# Patient Record
Sex: Male | Born: 1935 | Race: White | Hispanic: No | Marital: Married | Smoking: Former smoker
Health system: Southern US, Community
[De-identification: ages and names within clinical notes are randomized; demographics above are authoritative.]

## PROBLEM LIST (undated history)

## (undated) DIAGNOSIS — I10 Essential (primary) hypertension: Secondary | ICD-10-CM

## (undated) DIAGNOSIS — K635 Polyp of colon: Secondary | ICD-10-CM

## (undated) DIAGNOSIS — E7800 Pure hypercholesterolemia, unspecified: Secondary | ICD-10-CM

## (undated) DIAGNOSIS — K219 Gastro-esophageal reflux disease without esophagitis: Secondary | ICD-10-CM

## (undated) DIAGNOSIS — C61 Malignant neoplasm of prostate: Secondary | ICD-10-CM

## (undated) DIAGNOSIS — I739 Peripheral vascular disease, unspecified: Secondary | ICD-10-CM

## (undated) HISTORY — PX: CHOLECYSTECTOMY: SHX55

## (undated) HISTORY — DX: Peripheral vascular disease, unspecified: I73.9

---

## 1998-05-23 HISTORY — PX: COLON SURGERY: SHX602

## 2002-02-15 ENCOUNTER — Encounter: Payer: Self-pay | Admitting: *Deleted

## 2002-02-15 ENCOUNTER — Encounter: Admission: RE | Admit: 2002-02-15 | Discharge: 2002-02-15 | Payer: Self-pay | Admitting: *Deleted

## 2007-07-17 ENCOUNTER — Ambulatory Visit: Admission: RE | Admit: 2007-07-17 | Discharge: 2007-10-15 | Payer: Self-pay | Admitting: Radiation Oncology

## 2007-07-24 ENCOUNTER — Emergency Department (HOSPITAL_COMMUNITY): Admission: EM | Admit: 2007-07-24 | Discharge: 2007-07-25 | Payer: Self-pay | Admitting: Family Medicine

## 2007-08-29 ENCOUNTER — Encounter: Admission: RE | Admit: 2007-08-29 | Discharge: 2007-08-29 | Payer: Self-pay | Admitting: Internal Medicine

## 2007-10-01 ENCOUNTER — Encounter: Admission: RE | Admit: 2007-10-01 | Discharge: 2007-10-01 | Payer: Self-pay | Admitting: Urology

## 2007-10-03 ENCOUNTER — Encounter: Admission: RE | Admit: 2007-10-03 | Discharge: 2007-12-03 | Payer: Self-pay | Admitting: Neurology

## 2007-10-22 ENCOUNTER — Ambulatory Visit (HOSPITAL_BASED_OUTPATIENT_CLINIC_OR_DEPARTMENT_OTHER): Admission: RE | Admit: 2007-10-22 | Discharge: 2007-10-22 | Payer: Self-pay | Admitting: Urology

## 2007-11-06 ENCOUNTER — Ambulatory Visit (HOSPITAL_BASED_OUTPATIENT_CLINIC_OR_DEPARTMENT_OTHER): Admission: RE | Admit: 2007-11-06 | Discharge: 2007-11-06 | Payer: Self-pay | Admitting: Orthopedic Surgery

## 2007-11-07 ENCOUNTER — Ambulatory Visit: Admission: RE | Admit: 2007-11-07 | Discharge: 2007-11-29 | Payer: Self-pay | Admitting: Radiation Oncology

## 2007-12-31 ENCOUNTER — Encounter: Admission: RE | Admit: 2007-12-31 | Discharge: 2007-12-31 | Payer: Self-pay | Admitting: Gastroenterology

## 2008-01-18 ENCOUNTER — Inpatient Hospital Stay (HOSPITAL_COMMUNITY): Admission: RE | Admit: 2008-01-18 | Discharge: 2008-01-22 | Payer: Self-pay | Admitting: General Surgery

## 2008-01-18 ENCOUNTER — Encounter (INDEPENDENT_AMBULATORY_CARE_PROVIDER_SITE_OTHER): Payer: Self-pay | Admitting: General Surgery

## 2009-03-10 ENCOUNTER — Encounter (INDEPENDENT_AMBULATORY_CARE_PROVIDER_SITE_OTHER): Payer: Self-pay | Admitting: Urology

## 2009-03-10 ENCOUNTER — Ambulatory Visit (HOSPITAL_BASED_OUTPATIENT_CLINIC_OR_DEPARTMENT_OTHER): Admission: RE | Admit: 2009-03-10 | Discharge: 2009-03-10 | Payer: Self-pay | Admitting: Urology

## 2009-06-16 IMAGING — CT CT PELVIS W/ CM
2 of 5 series · 16 of 46 positions shown, 18 images · IV contrast (READICAT/WATER & [ID] OMNI 300)
Comparison: No prior studies.

CT ABDOMEN

CLINICAL DATA: Cecal mass found on endoscopy.  Please evaluate.

CT ABDOMEN AND PELVIS WITH CONTRAST
TECHNIQUE: Multidetector CT imaging of the abdomen and pelvis was
performed using the standard protocol following bolus
administration of intravenous contrast.
Contrast: 125 ml Rmnipaque-KZZ.

[Series 3: routine abdomen · axial · 0.81mm/px · z∈[-468,-68]mm · 13 of 90 slices shown, 15 images]
[im 5/90  soft-tissue]
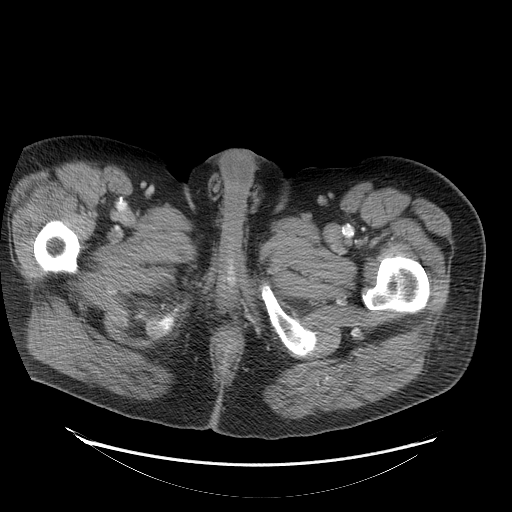
[im 5/90  bone]
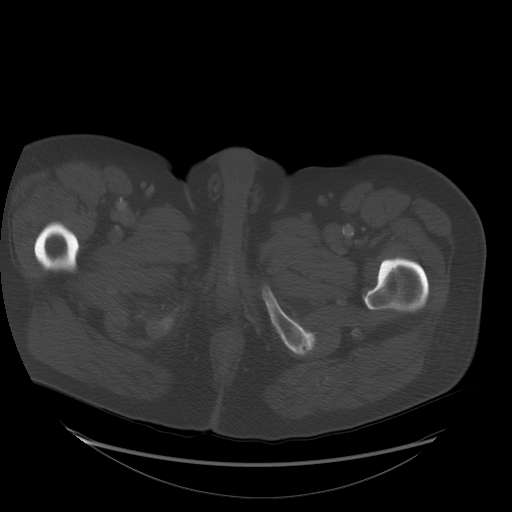
[im 10/90  soft-tissue]
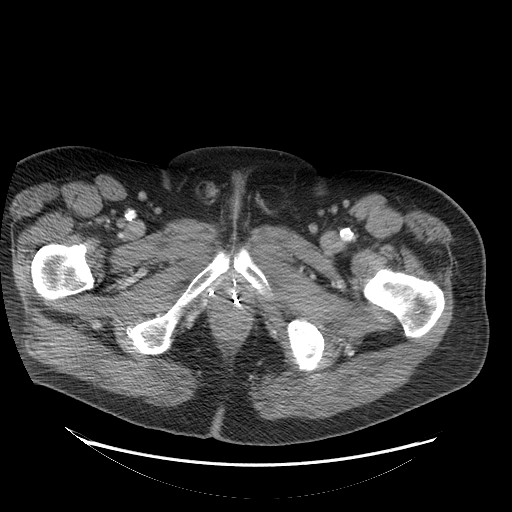
[im 20/90  soft-tissue]
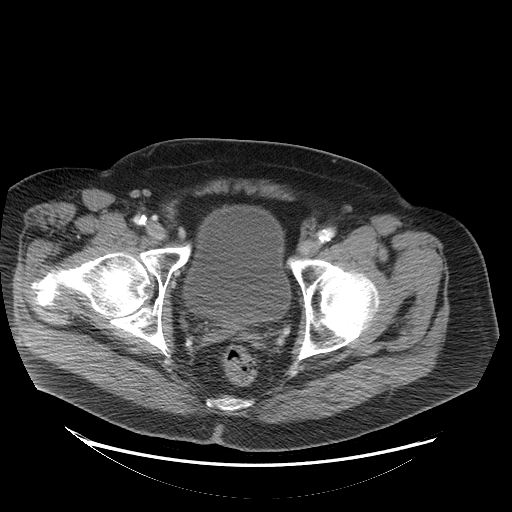
[im 25/90  soft-tissue]
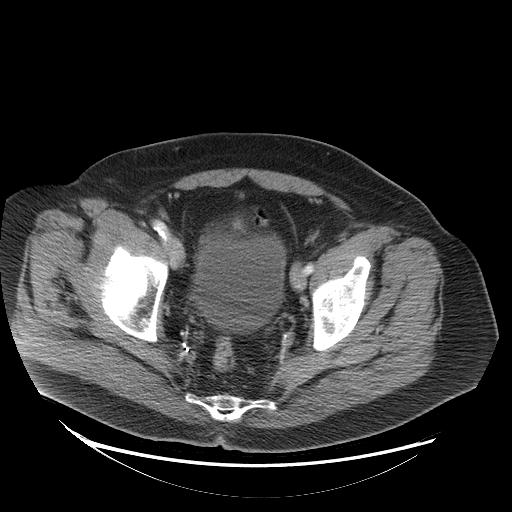
[im 30/90  soft-tissue]
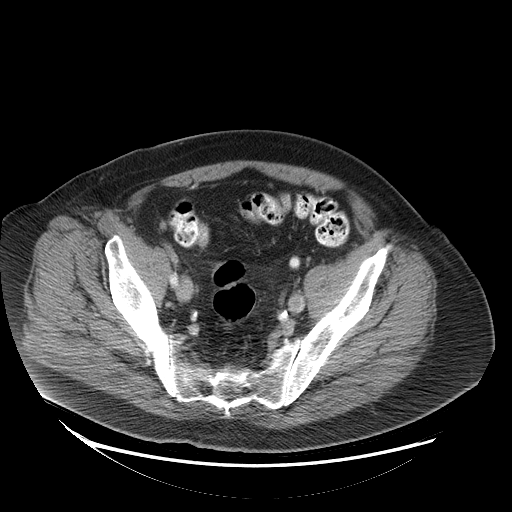
[im 40/90  soft-tissue]
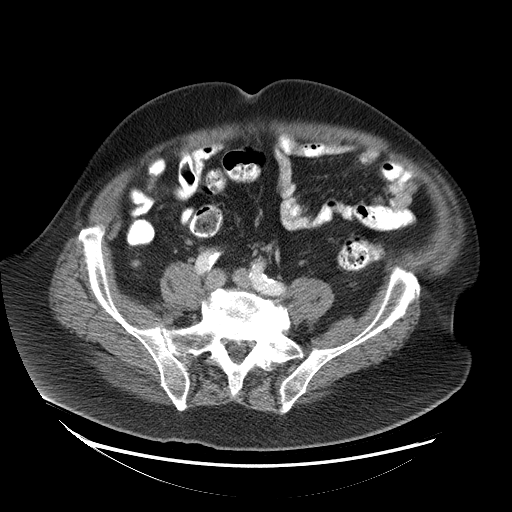
[im 45/90  soft-tissue]
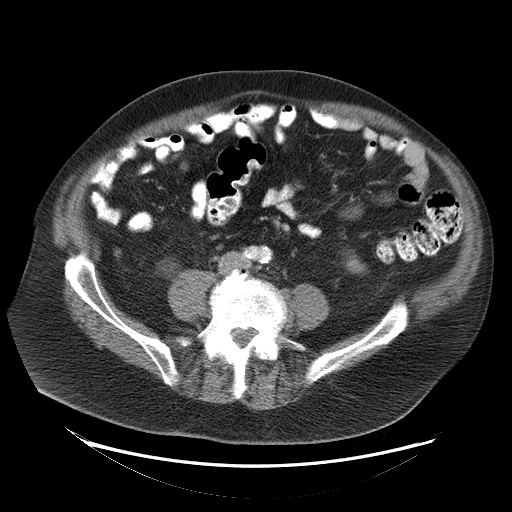
[im 50/90  soft-tissue]
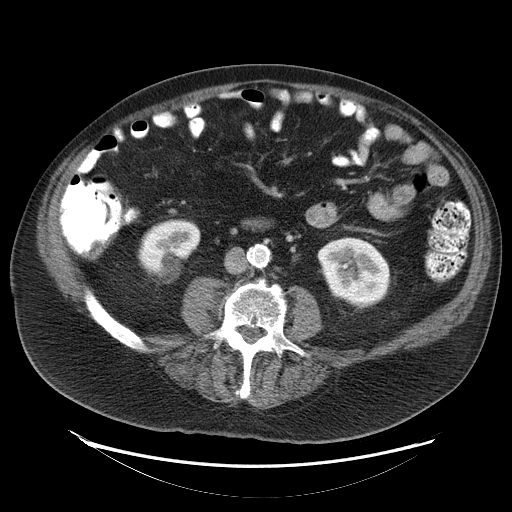
[im 60/90  soft-tissue]
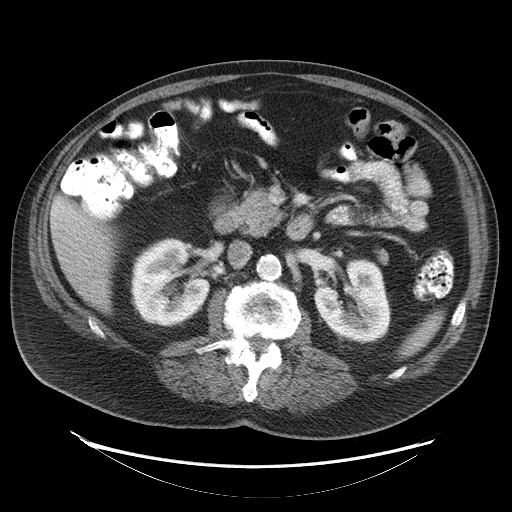
[im 60/90  bone]
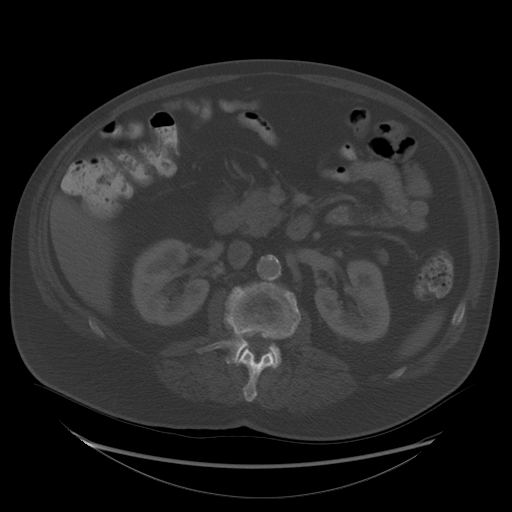
[im 65/90  soft-tissue]
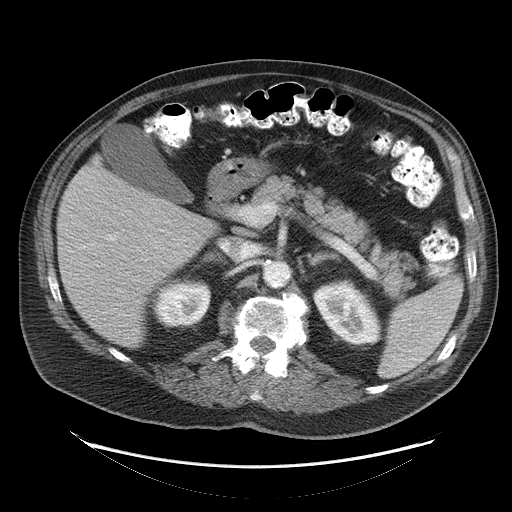
[im 70/90  soft-tissue]
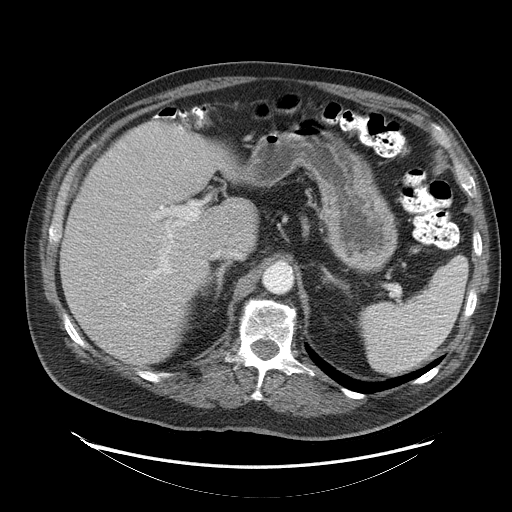
[im 80/90  soft-tissue]
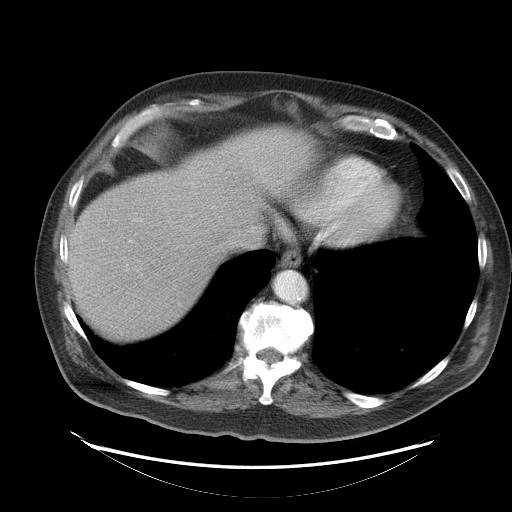
[im 85/90  soft-tissue]
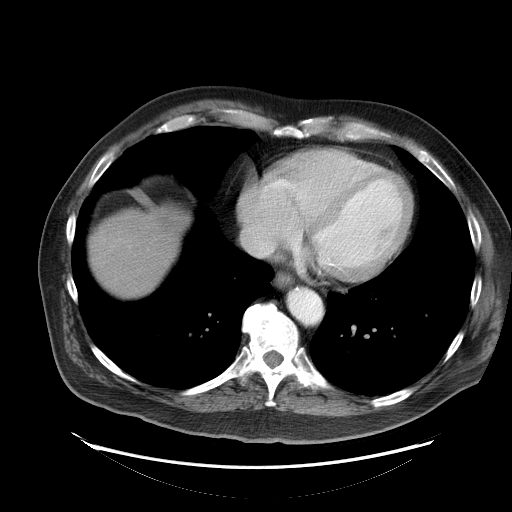

[Series 602: sagittal body · sagittal · 0.89mm/px · 3 of 168 slices shown]
[im 56/168  soft-tissue]
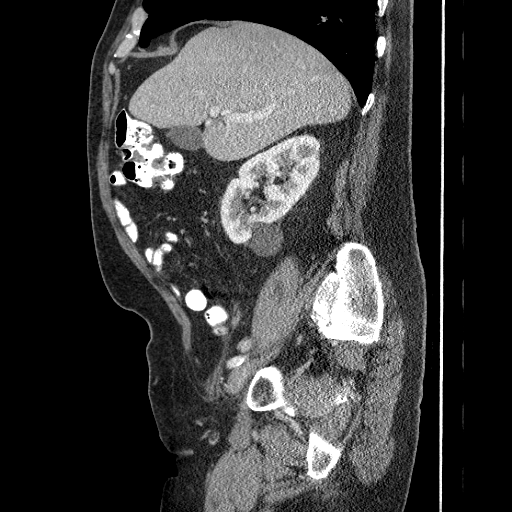
[im 75/168  soft-tissue]
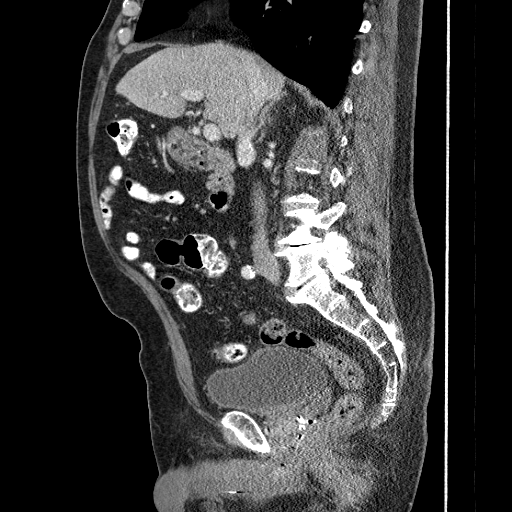
[im 93/168  soft-tissue]
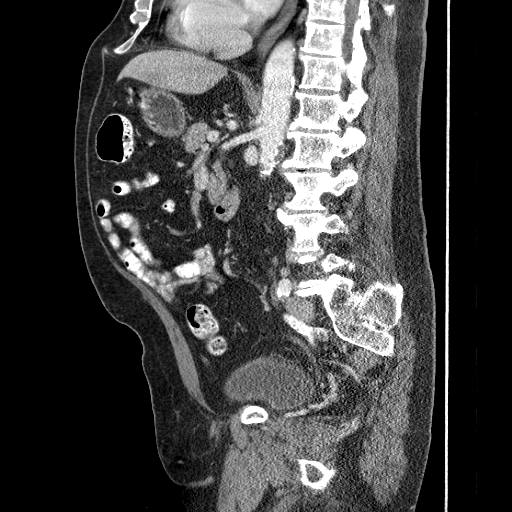

[16 of 46 positions shown; findings below may reference images not displayed]

FINDINGS: Scans of the lung bases are negative.  The liver,
spleen, pancreas, and adrenal glands have a normal appearance.
There is a 2.9 cm in size simple appearing lower pole right renal
cyst present and  there is a small (1.7 cm) simple appearing  left
renal cyst present.  There is thickening of the posterior inferior
wall of the cecum near the base of the appendix.  Likely, this
represents the cecal mass noted on endoscopy.  In addition,  there
are several mildly prominent mesenteric lymph nodes within the
right lower quadrant located anterior to the right kidney.  A lymph
node measuring 8 x 11 mm in size is seen on image number 37 series
3.  A lymph node measuring 6 x 9 mm in size is noted on image 41
series 3.  Also there is a portacaval lymph node present measuring
8 x 24 mm in size on image 20 six series 3.  There is no
retroperitoneal adenopathy.
IMPRESSION: 1.  Thickening of the wall of the base of the cecum near the
insertion site of the appendix.  Likely, this represents the cecal
mass noted on endoscopy.  This measures approximately 1.4 x 2.8 cm
in size on image 42 series [DATE].  Mildly prominent mesenteric lymph nodes noted within the right
lower quadrant anterior to the right kidney as discussed above.
These may represent a neoplastic mesenteric lymph nodes.  However,
in this size range these may also represent reactive lymph nodes.
There is also a mild to moderately enlarged portacaval lymph node
noted as well.  No evidence for hepatic metastases.

CT PELVIS
FINDINGS: Again noted is the thickening of the wall of the base of
the cecum most likely accounting for the cecal mass noted at
endoscopy.  There is no pelvic adenopathy or free pelvic fluid.
The urinary bladder has a normal appearance.  Multiple radio active
seeds are seen associated with the prostate gland.
IMPRESSION: Negative CT the pelvis aside for the cecal mass noted and described
on the CT of the abdomen report.

## 2009-09-29 ENCOUNTER — Ambulatory Visit (HOSPITAL_BASED_OUTPATIENT_CLINIC_OR_DEPARTMENT_OTHER): Admission: RE | Admit: 2009-09-29 | Discharge: 2009-09-29 | Payer: Self-pay | Admitting: Urology

## 2010-01-14 ENCOUNTER — Encounter: Admission: RE | Admit: 2010-01-14 | Discharge: 2010-01-14 | Payer: Self-pay | Admitting: Family Medicine

## 2010-01-26 ENCOUNTER — Encounter: Admission: RE | Admit: 2010-01-26 | Discharge: 2010-01-26 | Payer: Self-pay | Admitting: Family Medicine

## 2010-06-13 ENCOUNTER — Encounter: Payer: Self-pay | Admitting: Internal Medicine

## 2010-08-10 LAB — POCT I-STAT 4, (NA,K, GLUC, HGB,HCT)
Glucose, Bld: 126 mg/dL — ABNORMAL HIGH (ref 70–99)
HCT: 40 % (ref 39.0–52.0)
Sodium: 140 mEq/L (ref 135–145)

## 2010-08-26 LAB — CBC
MCHC: 34.8 g/dL (ref 30.0–36.0)
RDW: 13.4 % (ref 11.5–15.5)

## 2010-08-26 LAB — BASIC METABOLIC PANEL
CO2: 27 mEq/L (ref 19–32)
Calcium: 9.9 mg/dL (ref 8.4–10.5)
Creatinine, Ser: 0.82 mg/dL (ref 0.4–1.5)
Glucose, Bld: 120 mg/dL — ABNORMAL HIGH (ref 70–99)

## 2010-10-05 NOTE — Op Note (Signed)
NAMEMEET, WEATHINGTON                ACCOUNT NO.:  [id number]   MEDICAL RECORD NO.:  [id number]          PATIENT TYPE:  AMB   LOCATION:  [facility]SC                          FACILITY:  [facility]   PHYSICIAN:  Katy Fitch. Sypher, M.D. DATE OF BIRTH:  07-Mar-1934   DATE OF PROCEDURE:  11/06/2007  DATE OF DISCHARGE:                               OPERATIVE REPORT   PREOPERATIVE DIAGNOSES:  Entrapped neuropathy, median nerve, right  carpal tunnel.   POSTOPERATIVE DIAGNOSES:  Entrapped neuropathy, median nerve, right  carpal tunnel.   OPERATION:  Release of right transverse carpal ligament.   OPERATING SURGEON:  Katy Fitch. Sypher, M.D.   ASSISTANT:  None.   ANESTHESIA:  General by LMA.   SUPERVISING ANESTHESIOLOGIST:  Kaylyn Layer. Michelle Piper, M.D.   INDICATIONS:  Jevan Gaunt is a 77 year old man referred to the courtesy  of Dr. Melbourne Abts for evaluation and management of his stiff and numb  right hand.  Clinical examination revealed signs of severe carpal tunnel  syndrome.  Prior electrodiagnostic studies confirmed significant median  neuropathy.   Due to failure to respond to the nonoperative measures, Mr. Speigner was  brought to the operating room at this time for release of his right  transverse carpal ligament.   PROCEDURE:  Kenny Rea was brought to the operating room and placed in  supine position on the operating table.  More than 30 years ago, Mr.  Bynum sustained a complex fracture of his right elbow.  He had  inability to fully extend the elbow and inability to supinate beyond  approximately 30 degrees.  We accommodated this during his surgery.   After informed consent, general anesthesia by LMA technique was induced  followed by routine Betadine scrub and paint of the right upper  extremity.  A pneumatic tourniquet was applied to the proximal right  brachium.   Following exsanguination of the right arm with an Esmarch bandage, the  tourniquet was insufflated to 220 mmHg.  Procedure  commenced with a  short incision in the line of the ring finger of the palm.  Subcutaneous  tissues were carefully divided via palmar fascia.  This split  longitudinally to reveal common sensory branch of the median nerve.  These were followed back to transcarpal ligament, which was gently  isolated from median nerve.  The ligament was then released along its  ulnar border extending into the distal forearm.   This widely opened carpal canal.  No mass or particular instrument  noted.   The wounds then repaired with intradermal 2-0 Prolene suture.   A compressive dressing was applied with volar plaster splint maintaining  the wrist in 5 degrees of dorsiflexion.      Katy Fitch Sypher, M.D.  Electronically Signed     RVS/MEDQ  D:  11/06/2007  T:  11/07/2007  Job:  [id number]

## 2010-10-05 NOTE — Discharge Summary (Signed)
NAMEMARCELLES, CLINARD                ACCOUNT NO.:  [id number]   MEDICAL RECORD NO.:  [id number]          PATIENT TYPE:  INP   LOCATION:  5150                         FACILITY:  [facility]   PHYSICIAN:  Cherylynn Ridges, M.D.    DATE OF BIRTH:  1934-05-02   DATE OF ADMISSION:  01/18/2008  DATE OF DISCHARGE:  01/22/2008                               DISCHARGE SUMMARY   DISCHARGE DIAGNOSIS:  Dysplastic pedunculated cecal polyp.   PRINCIPAL PROCEDURE:  Right colectomy.   Additional diagnoses include hypertension and gastroesophageal reflux  disease.   DISCHARGE MEDICATIONS:  In addition to his preoperative medication, he  will be getting Percocet 5/325 tablets one to two every 4 hours as  needed for pain.  He also has a history of prostate cancer.  He will be  discharged home in the care of his family.  He will return to see me,  Dr. Lindie Spruce, in 2 weeks.   BRIEF SUMMARY OF HOSPITAL COURSE:  The patient was admitted on the day  of his procedure at which time he underwent a right colectomy for a  pedunculated polyp.  Pathology subsequently revealed this to be a  dysplastic pedunculated polyp with no evidence of malignancy or  lymphatic involvement.  Postoperatively, he did well.  He had low-grade  fevers, but by the time of discharge, he had been afebrile for more than  48 hours.  He had had three bowel movements in the last 48 hours, last 2  coming the day of discharge with minimal bleeding.  His wound was  slightly erythematous posterolaterally, but this is likely secondary to  the dependent blood loss in the subcutaneous tissue.  The patient had a  subcuticular stitch of the transverse incision in the right perirectus  area, which is healing well.  No evidence of infection or drainage.  He  is eating well, having good bowel movements, and he will be discharged  home in the care of his family.  He will come back to see me in about 2  weeks.      Cherylynn Ridges, M.D.  Electronically  Signed     JOW/MEDQ  D:  01/22/2008  T:  01/23/2008  Job:  [id number]

## 2010-10-05 NOTE — Op Note (Signed)
NAMEDAEGON, DEISS                ACCOUNT NO.:  [id number]   MEDICAL RECORD NO.:  [id number]         PATIENT TYPE:  HAMB   LOCATION:                               FACILITY:  [facility]   PHYSICIAN:  Houston M. Kimbrough, M.D.DATE OF BIRTH:  [birth year]   DATE OF PROCEDURE:  10/22/2007  DATE OF DISCHARGE:                               OPERATIVE REPORT   PREOPERATIVE DIAGNOSIS:  Carcinoma of prostate, clinical stage T2b,  Gleason 4+3 adenocarcinoma of the prostate.   POSTOPERATIVE DIAGNOSIS:  Carcinoma of prostate, clinical stage T2b,  Gleason 4+3 adenocarcinoma of the prostate.   OPERATIONS:  Cystoscopy, prostate brachytherapy.   SURGEON:  Courtney Paris, M.D.   ASSISTANT:  Maryln Gottron, M.D.   ANESTHESIA:  General.   BRIEF HISTORY:  This 77 year old patient is admitted with intermediate  prostate cancer T2b, Gleason 4+3 for seed implant.  He finished his  external beam radiotherapy on Sep 28, 2007, and enters now for a seed  implant.  He understands the risks as explained to him.   DESCRIPTION OF PROCEDURE:  The patient was placed on the operating table  in the dorsal lithotomy position.  After satisfactory induction of  general endotracheal anesthesia, he was prepped and draped with Betadine  in the usual sterile fashion.  A Foley catheter and rectal tube were  inserted, as well as the 7.5 MHz ultrasound probe.  Dr. Dayton Scrape then  planned the procedure and then I was called in.  He had a total of 24  needles with 71 I-125 seeds implanted with good placement of the seeds,  at least by ultrasound and by fluoroscopy.  We used the Nucletron to  deliver and build the seeds.  When this was done, the patient was then  reprepped and draped and a flexible cystoscope was used to inspect the  anterior urethra.  There was a small blood clot in the posterior  urethra, but no seeds seen and the bladder was carefully inspected.  There were no seeds either in the bladder or that I could  see coming  through the mucosa of the prostate on retrograde view.  The bladder was  drained.  A 16 Foley catheter inserted and the patient taken to the  recovery room in good condition where he will be later discharged as an  outpatient.  Will give him detailed written instructions, and he will  remove his catheter in 48 hours.      Courtney Paris, M.D.  Electronically Signed     HMK/MEDQ  D:  10/22/2007  T:  10/22/2007  Job:  [id number]

## 2010-10-05 NOTE — Op Note (Signed)
[name], CROSS NO.:  [id number]   MEDICAL RECORD NO.:  [id number]          PATIENT TYPE:  INP   LOCATION:  5118                         FACILITY:  [facility]   PHYSICIAN:  Cherylynn Ridges, M.D.    DATE OF BIRTH:  [birth year]   DATE OF PROCEDURE:  01/18/2008  DATE OF DISCHARGE:                               OPERATIVE REPORT   PREOPERATIVE DIAGNOSIS:  Cecal mass.   POSTOPERATIVE DIAGNOSIS:  Pedunculated cecal mass.   PROCEDURE:  Right colectomy.   SURGEON:  Marta Lamas. Lindie Spruce, MD   ASSISTANT:  Leonie Man, MD   ANESTHESIA:  General endotracheal.   ESTIMATED BLOOD LOSS:  100 mL.   COMPLICATIONS:  None.   CONDITION:  Stable.   SPECIMEN:  Terminal ileum and right colon.   INDICATIONS FOR OPERATION:  The patient is a 77 year old on previous  blood thinners who underwent a colonoscopy and who was found to have a  large right colon cecal mass.  Ethan Harrison now comes in for right colectomy.   FINDINGS AT SURGERY:  The patient was found to have a pedunculated cecal  mass upon opening the specimen after it removing from the field.  There  was no evidence of any intra-abdominal metastatic disease or other  pathology.   OPERATION:  The patient was taken to the operating room and placed on  table in supine position.  After an adequate general endotracheal  anesthetic was administered, he was prepped and draped in usual sterile  manner exposing the midline and the right abdomen.   We removed the specimen through a right transverse incision which went  through the anterior and posterior sheath and rectus muscle and  transecting the rectus sheath and also transecting the rectus muscle.  We took it down into the intra-abdominal cavity where we were able to  mobilize the right colon along the line of Toldt up to and extending  medial to the hepatic flexure.  We mobilized beyond the duodenum.  We  also mobilized what was an retrocecal appendix and I did resect the  appendix  along with the specimen in toto.  Once we had adequate  mobilization of the terminal ileum and the right transverse colon, we  came across the terminal ileum and right transverse colon using a GIA-75  stapler.  We then came across the intervening mesentery which was  thickened but did not appear to have significant adenopathy with Kelly  clamps and 2-0 silk ties.  The LigaSure was also used in some places.   There was adequate hemostasis, but after the specimen was removed we  packed the abdomen and then I opened the specimen on a separate table  exposing the mucosal surface.  A pedunculated mass in the cecum was  noted.   I changed out the outer layer of my gloves and then went back and  obtained hemostasis with electrocautery and irrigated.  We then did a  functional end-to-end and side-to-side anastomosis between the terminal  ileum and the right transverse colon using a GIA-75 stapler.  The  resulting enterotomy was closed using TX-60  stapler.  This was with a  3.5-mm closure staples.  The mesentery was closed using interrupted 2-0  silk sutures.  We attached the corner with 2-0 silk suture.  Once that  was done, we allowed the new anastomotic area to fall back into the  right lower quadrant and then covered it with omentum which had an  adequate length to go down to right lower quadrant.  We irrigated with  saline solution and then we closed in several layers.   The posterior rectus sheath was closed using a looped #1 PDS suture.  The anterior rectus sheath was closed using a running looped #1 PDS  suture and then we irrigated with saline solution in the subcu and  closed the skin using running subcuticular stitch of 4-0 Monocryl.  Marcaine 0.5% without epinephrine 20 mL were used to inject into the  wound.  We then covered with Dermabond, Steri-Strips, and Tegaderm.  All  needle counts, sponge counts, and instrument counts were correct.      Cherylynn Ridges, M.D.   Electronically Signed     JOW/MEDQ  D:  01/18/2008  T:  01/19/2008  Job:  [id number]   cc:   Marcene Duos, M.D.  Anselmo Rod, M.D.

## 2011-02-14 LAB — CBC
Hemoglobin: 14.1
RBC: 4.41
RDW: 12.9

## 2011-02-14 LAB — DIFFERENTIAL
Basophils Absolute: 0
Basophils Relative: 0
Eosinophils Absolute: 0
Eosinophils Relative: 0
Lymphocytes Relative: 14
Lymphs Abs: 1.6
Monocytes Absolute: 0.9
Monocytes Relative: 7
Neutro Abs: 9.4 — ABNORMAL HIGH
Neutrophils Relative %: 79 — ABNORMAL HIGH

## 2011-02-14 LAB — BASIC METABOLIC PANEL
Calcium: 9.3
GFR calc Af Amer: >60
GFR calc non Af Amer: >60
Sodium: 135

## 2011-02-14 LAB — PROTIME-INR: INR: 1

## 2011-02-14 LAB — APTT: aPTT: 34

## 2011-02-16 LAB — PROTIME-INR
INR: 1
Prothrombin Time: 13.5

## 2011-02-16 LAB — CBC
Hemoglobin: 11.5 — ABNORMAL LOW
MCHC: 34.5
MCV: 86.1
RBC: 3.86 — ABNORMAL LOW
RDW: 14.7
WBC: 7.5

## 2011-02-16 LAB — APTT: aPTT: 35

## 2011-02-16 LAB — COMPREHENSIVE METABOLIC PANEL
CO2: 27
Calcium: 9.7
Creatinine, Ser: 0.63
GFR calc non Af Amer: >60
Glucose, Bld: 83
Total Protein: 7

## 2011-02-17 LAB — BASIC METABOLIC PANEL
Calcium: 9.4
Creatinine, Ser: 0.69
GFR calc Af Amer: >60
GFR calc non Af Amer: >60
Sodium: 137

## 2011-02-17 LAB — POCT HEMOGLOBIN-HEMACUE: Hemoglobin: 12.4 — ABNORMAL LOW

## 2011-04-19 ENCOUNTER — Emergency Department (HOSPITAL_COMMUNITY): Payer: Medicare Other

## 2011-04-19 ENCOUNTER — Encounter: Payer: Self-pay | Admitting: *Deleted

## 2011-04-19 ENCOUNTER — Emergency Department (HOSPITAL_COMMUNITY)
Admission: EM | Admit: 2011-04-19 | Discharge: 2011-04-19 | Disposition: A | Discharge status: Home / Self Care | Payer: Medicare Other | Attending: Emergency Medicine | Admitting: Emergency Medicine

## 2011-04-19 DIAGNOSIS — K219 Gastro-esophageal reflux disease without esophagitis: Secondary | ICD-10-CM | POA: Insufficient documentation

## 2011-04-19 DIAGNOSIS — R319 Hematuria, unspecified: Secondary | ICD-10-CM | POA: Insufficient documentation

## 2011-04-19 DIAGNOSIS — R3915 Urgency of urination: Secondary | ICD-10-CM | POA: Insufficient documentation

## 2011-04-19 DIAGNOSIS — R5381 Other malaise: Secondary | ICD-10-CM | POA: Insufficient documentation

## 2011-04-19 DIAGNOSIS — R42 Dizziness and giddiness: Secondary | ICD-10-CM | POA: Insufficient documentation

## 2011-04-19 DIAGNOSIS — R5383 Other fatigue: Secondary | ICD-10-CM | POA: Insufficient documentation

## 2011-04-19 DIAGNOSIS — N390 Urinary tract infection, site not specified: Secondary | ICD-10-CM | POA: Insufficient documentation

## 2011-04-19 DIAGNOSIS — I10 Essential (primary) hypertension: Secondary | ICD-10-CM | POA: Insufficient documentation

## 2011-04-19 DIAGNOSIS — E7800 Pure hypercholesterolemia, unspecified: Secondary | ICD-10-CM | POA: Insufficient documentation

## 2011-04-19 DIAGNOSIS — E860 Dehydration: Secondary | ICD-10-CM | POA: Insufficient documentation

## 2011-04-19 DIAGNOSIS — Z8546 Personal history of malignant neoplasm of prostate: Secondary | ICD-10-CM | POA: Insufficient documentation

## 2011-04-19 DIAGNOSIS — R300 Dysuria: Secondary | ICD-10-CM | POA: Insufficient documentation

## 2011-04-19 DIAGNOSIS — M25519 Pain in unspecified shoulder: Secondary | ICD-10-CM | POA: Insufficient documentation

## 2011-04-19 DIAGNOSIS — R350 Frequency of micturition: Secondary | ICD-10-CM | POA: Insufficient documentation

## 2011-04-19 DIAGNOSIS — Z7982 Long term (current) use of aspirin: Secondary | ICD-10-CM | POA: Insufficient documentation

## 2011-04-19 DIAGNOSIS — R509 Fever, unspecified: Secondary | ICD-10-CM | POA: Insufficient documentation

## 2011-04-19 DIAGNOSIS — R000 Tachycardia, unspecified: Secondary | ICD-10-CM | POA: Insufficient documentation

## 2011-04-19 DIAGNOSIS — Z79899 Other long term (current) drug therapy: Secondary | ICD-10-CM | POA: Insufficient documentation

## 2011-04-19 HISTORY — DX: Gastro-esophageal reflux disease without esophagitis: K21.9

## 2011-04-19 HISTORY — DX: Malignant neoplasm of prostate: C61

## 2011-04-19 HISTORY — DX: Pure hypercholesterolemia, unspecified: E78.00

## 2011-04-19 HISTORY — DX: Essential (primary) hypertension: I10

## 2011-04-19 LAB — CBC
Hemoglobin: 10.4 g/dL — ABNORMAL LOW (ref 13.0–17.0)
MCV: 92.3 fL (ref 78.0–100.0)
Platelets: 384 10*3/uL (ref 150–400)
RBC: 3.38 MIL/uL — ABNORMAL LOW (ref 4.22–5.81)
WBC: 13.9 10*3/uL — ABNORMAL HIGH (ref 4.0–10.5)

## 2011-04-19 LAB — DIFFERENTIAL
Eosinophils Relative: 1 % (ref 0–5)
Lymphocytes Relative: 15 % (ref 12–46)
Lymphs Abs: 2 10*3/uL (ref 0.7–4.0)
Monocytes Relative: 8 % (ref 3–12)

## 2011-04-19 LAB — BASIC METABOLIC PANEL
CO2: 22 mEq/L (ref 19–32)
Glucose, Bld: 128 mg/dL — ABNORMAL HIGH (ref 70–99)
Potassium: 4.2 mEq/L (ref 3.5–5.1)
Sodium: 135 mEq/L (ref 135–145)

## 2011-04-19 MED ORDER — SODIUM CHLORIDE 0.9 % IV BOLUS (SEPSIS)
1000.0000 mL | Freq: Once | INTRAVENOUS | Status: AC
  Administered 2011-04-19: 1000 mL via INTRAVENOUS

## 2011-04-19 NOTE — ED Notes (Signed)
Pt has had blood in urine 2 nites ago and then came into see urologist and bleeding had cleared.  Urine specimen showed infections and gave him 2 shots.  Wanted pt to be seen for bp in the 90s systolic and elevated heart rate in the 120s.  Pt reports some shoulder aching.

## 2011-04-19 NOTE — ED Provider Notes (Signed)
History     CSN: [id number] Arrival date & time: 04/19/2011 12:04 PM   First MD Initiated Contact with Patient 04/19/11 1312      Chief Complaint  Patient presents with  . Shoulder Pain    low bp and elevated hr--sent by md    (Consider location/radiation/quality/duration/timing/severity/associated sxs/prior treatment) HPI Comments: Patient presents today after being seen in his urologists office for urinary tract infection and having a low blood pressure and tachycardic heart rate.  He reports being lightheaded at the time but did not pass out.  He denies any chest pain.  Patient notes he's had symptoms of a urinary tract infection over the last one to 2 weeks.  His symptoms include frequency and urgency.  He has had some subjective fevers.  He reports that he was given 2 shots in the urologists office and was called in a prescription for antibiotics for home as well.  They found his blood pressure to be low and his heart rate to be in the 120s and recommended he come to the emergency department for further evaluation.  At this time patient is not lightheaded.  He does note some right shoulder pain over the last few days.  It is worse with certain positions and movements.  It is not exertional.  He has no shortness of breath or cough.  No nausea or vomiting.  Patient is a 77 y.o. male presenting with weakness. The history is provided by the patient. No language interpreter was used.  Weakness The primary symptoms include fever. Primary symptoms do not include headaches, syncope, loss of consciousness, altered mental status, seizures, dizziness, visual change, paresthesias, focal weakness, loss of sensation, speech change, memory loss, nausea or vomiting.  Additional symptoms include weakness.    Past Medical History  Diagnosis Date  . Hypertension   . Prostate cancer   . GERD (gastroesophageal reflux disease)   . Hypercholesteremia     Past Surgical History  Procedure Date  . Colon  surgery     No family history on file.  History  Substance Use Topics  . Smoking status: Never Smoker   . Smokeless tobacco: Not on file  . Alcohol Use: Yes     occ      Review of Systems  Constitutional: Positive for fever. Negative for chills.  HENT: Negative.   Eyes: Negative.  Negative for discharge and redness.  Respiratory: Negative.  Negative for cough and shortness of breath.   Cardiovascular: Negative.  Negative for chest pain and syncope.  Gastrointestinal: Negative.  Negative for nausea, vomiting and abdominal pain.  Genitourinary: Positive for dysuria, urgency, frequency and hematuria.  Musculoskeletal: Positive for back pain.  Skin: Negative.  Negative for color change and rash.  Neurological: Positive for weakness and light-headedness. Negative for dizziness, speech change, focal weakness, seizures, loss of consciousness, syncope, headaches and paresthesias.  Hematological: Negative.  Negative for adenopathy.  Psychiatric/Behavioral: Negative.  Negative for memory loss, confusion and altered mental status.  All other systems reviewed and are negative.    Allergies  Review of patient's allergies indicates no known allergies.  Home Medications   Current Outpatient Rx  Name Route Sig Dispense Refill  . ACETAMINOPHEN ER 650 MG PO TBCR Oral Take 1,300 mg by mouth 2 (two) times daily.      . ASPIRIN EC 81 MG PO TBEC Oral Take 81 mg by mouth daily.      Marland Kitchen CALCIUM CARBONATE-VITAMIN D 500-200 MG-UNIT PO TABS Oral Take 1 tablet  by mouth 2 (two) times daily.      Marland Kitchen VITAMIN D 1000 UNITS PO TABS Oral Take 1,000 Units by mouth daily.      Marland Kitchen DOXAZOSIN MESYLATE 8 MG PO TABS Oral Take 8 mg by mouth at bedtime.      Marland Kitchen GEMFIBROZIL 600 MG PO TABS Oral Take 600 mg by mouth 2 (two) times daily before a meal.      . LISINOPRIL 10 MG PO TABS Oral Take 10 mg by mouth daily.      . OCUVITE-LUTEIN PO CAPS Oral Take 2 capsules by mouth daily.      Marland Kitchen OMEPRAZOLE 20 MG PO CPDR Oral Take  20 mg by mouth daily as needed. For acid reflux       BP 111/60  Pulse 105  Temp(Src) 97.9 F (36.6 C) (Oral)  Resp 18  SpO2 97%  Physical Exam  Constitutional: He is oriented to person, place, and time. He appears well-developed and well-nourished.  Non-toxic appearance. He does not have a sickly appearance.  HENT:  Head: Normocephalic and atraumatic.  Eyes: Conjunctivae, EOM and lids are normal. Pupils are equal, round, and reactive to light.  Neck: Trachea normal, normal range of motion and full passive range of motion without pain. Neck supple.  Cardiovascular: Regular rhythm, S1 normal, S2 normal and normal heart sounds.  Tachycardia present.   Pulmonary/Chest: Effort normal and breath sounds normal. No respiratory distress. He has no wheezes. He has no rales. He exhibits no tenderness.  Abdominal: Soft. Normal appearance. He exhibits no distension. There is no tenderness. There is no rebound and no CVA tenderness.  Musculoskeletal: Normal range of motion.  Neurological: He is alert and oriented to person, place, and time. He has normal strength.  Skin: Skin is warm, dry and intact. No rash noted.  Psychiatric: He has a normal mood and affect. His behavior is normal. Judgment and thought content normal.    ED Course  Procedures (including critical care time)  Results for orders placed during the hospital encounter of 04/19/11  CBC      Component Value Range   WBC 13.9 (*) 4.0 - 10.5 (K/uL)   RBC 3.38 (*) 4.22 - 5.81 (MIL/uL)   Hemoglobin 10.4 (*) 13.0 - 17.0 (g/dL)   HCT 01.0 (*) 27.2 - 52.0 (%)   MCV 92.3  78.0 - 100.0 (fL)   MCH 30.8  26.0 - 34.0 (pg)   MCHC 33.3  30.0 - 36.0 (g/dL)   RDW 53.6  64.4 - 03.4 (%)   Platelets 384  150 - 400 (K/uL)  DIFFERENTIAL      Component Value Range   Neutrophils Relative 76  43 - 77 (%)   Neutro Abs 10.6 (*) 1.7 - 7.7 (K/uL)   Lymphocytes Relative 15  12 - 46 (%)   Lymphs Abs 2.0  0.7 - 4.0 (K/uL)   Monocytes Relative 8  3 - 12  (%)   Monocytes Absolute 1.2 (*) 0.1 - 1.0 (K/uL)   Eosinophils Relative 1  0 - 5 (%)   Eosinophils Absolute 0.1  0.0 - 0.7 (K/uL)   Basophils Relative 0  0 - 1 (%)   Basophils Absolute 0.0  0.0 - 0.1 (K/uL)  BASIC METABOLIC PANEL      Component Value Range   Sodium 135  135 - 145 (mEq/L)   Potassium 4.2  3.5 - 5.1 (mEq/L)   Chloride 100  96 - 112 (mEq/L)   CO2 22  19 -  32 (mEq/L)   Glucose, Bld 128 (*) 70 - 99 (mg/dL)   BUN 33 (*) 6 - 23 (mg/dL)   Creatinine, Ser 1.61  0.50 - 1.35 (mg/dL)   Calcium 9.7  8.4 - 09.6 (mg/dL)   GFR calc non Af Amer 53 (*) >90 (mL/min)   GFR calc Af Amer 61 (*) >90 (mL/min)  LACTIC ACID, PLASMA      Component Value Range   Lactic Acid, Venous 1.0  0.5 - 2.2 (mmol/L)   Dg Chest 2 View  04/19/2011  *RADIOLOGY REPORT*  Clinical Data: Sharp upper back pain and pain over the left scapula for the past week.  CHEST - 2 VIEW  Comparison: 03/10/2009.  Findings: Poor inspiration.  No gross change in borderline enlargement of the cardiac silhouette.  Minimal bibasilar atelectasis.  Otherwise, clear lungs.  Extensive thoracic spine and bilateral shoulder degenerative changes.  IMPRESSION:  1.  Poor inspiration with minimal bibasilar atelectasis. 2.  Extensive thoracic spine and bilateral shoulder degenerative changes.  Original Report Authenticated By: Darrol Angel, M.D.       Date: 04/19/2011  Rate: 102  Rhythm: sinus tachycardia  QRS Axis: normal  Intervals: normal  ST/T Wave abnormalities: nonspecific T wave changes  Conduction Disutrbances:none  Narrative Interpretation:   Old EKG Reviewed: none available  MDM  Patient with normal vital signs here in the emergency department.  After 1 L of normal saline the patient had orthostatic vital signs completed and is not significantly tachycardic or hypotensive.  Patient denies any lightheadedness or dizziness.  He has a normal lactate level so does not appear to have laboratory indicators for acute sepsis.   Patient does have some leukocytosis which correlates with his UTI that is seen on his urology note which the patient probably them.  Patient received ceftriaxone and gentamicin in the office for antibiotic treatment so he is artery been treated for this.  They also called him a prescription for ciprofloxacin to the pharmacy.  Patient's shoulder pain appears to be musculoskeletal as it changes with position.  At this point in time the patient feels well and is without further lightheadedness symptoms I feel he is safe for discharge with followup with his primary care physician and his urologist and can return for further symptoms of lightheadedness.        Nat Christen, MD 04/19/11 1515

## 2011-04-19 NOTE — ED Notes (Signed)
Pt sent here by urology office after appt due to hypotension and tachycardia.  Upon arrival here pt reports he feels great and he is now normotensive and sinus on the monitor.  Pt is being treated by a urologist currently for infection and overactive bladder.   Pt denies pain.

## 2011-04-21 ENCOUNTER — Encounter (HOSPITAL_COMMUNITY): Payer: Self-pay

## 2011-04-21 ENCOUNTER — Emergency Department (HOSPITAL_COMMUNITY)
Admission: EM | Admit: 2011-04-21 | Discharge: 2011-04-21 | Disposition: A | Discharge status: Home / Self Care | Payer: Medicare Other | Attending: Emergency Medicine | Admitting: Emergency Medicine

## 2011-04-21 DIAGNOSIS — N390 Urinary tract infection, site not specified: Secondary | ICD-10-CM | POA: Insufficient documentation

## 2011-04-21 DIAGNOSIS — K219 Gastro-esophageal reflux disease without esophagitis: Secondary | ICD-10-CM | POA: Insufficient documentation

## 2011-04-21 DIAGNOSIS — Z79899 Other long term (current) drug therapy: Secondary | ICD-10-CM | POA: Insufficient documentation

## 2011-04-21 DIAGNOSIS — R339 Retention of urine, unspecified: Secondary | ICD-10-CM | POA: Insufficient documentation

## 2011-04-21 DIAGNOSIS — E7800 Pure hypercholesterolemia, unspecified: Secondary | ICD-10-CM | POA: Insufficient documentation

## 2011-04-21 DIAGNOSIS — I10 Essential (primary) hypertension: Secondary | ICD-10-CM | POA: Insufficient documentation

## 2011-04-21 LAB — URINALYSIS, ROUTINE W REFLEX MICROSCOPIC
Glucose, UA: NEGATIVE mg/dL
Leukocytes, UA: NEGATIVE
pH: 6 (ref 5.0–8.0)

## 2011-04-21 MED ORDER — LIDOCAINE HCL 2 % EX GEL
Freq: Once | CUTANEOUS | Status: AC
  Administered 2011-04-21: 08:00:00 via URETHRAL
  Filled 2011-04-21: qty 10

## 2011-04-21 MED ORDER — LIDOCAINE HCL 2 % EX GEL: Freq: Once | CUTANEOUS | Status: DC

## 2011-04-21 NOTE — ED Notes (Signed)
Leg bag connected to foley catheter & pt provided instructions for home catheter care

## 2011-04-21 NOTE — ED Provider Notes (Signed)
History     CSN: [id number] Arrival date & time: 04/21/2011  7:05 AM   First MD Initiated Contact with Patient 04/21/11 0719      Chief Complaint  Patient presents with  . Urinary Retention    (Consider location/radiation/quality/duration/timing/severity/associated sxs/prior treatment) Patient is a 77 y.o. male presenting with frequency. The history is provided by the patient.  Urinary Frequency This is a new problem. The current episode started more than 2 days ago. The problem occurs constantly. The problem has been gradually worsening. Associated symptoms include abdominal pain. Pertinent negatives include no shortness of breath. Associated symptoms comments: No fever, vomiting, nausea. Unable to void completely. Feels like he needs to urinate every 2 minutes. Bladder spasms.. Exacerbated by: Urinating  The symptoms are relieved by nothing. Treatments tried: On antibiotics since Tuesday for urinary tract infection. The treatment provided no relief.    Past Medical History  Diagnosis Date  . Hypertension   . Prostate cancer   . GERD (gastroesophageal reflux disease)   . Hypercholesteremia     Past Surgical History  Procedure Date  . Colon surgery     History reviewed. No pertinent family history.  History  Substance Use Topics  . Smoking status: Never Smoker   . Smokeless tobacco: Not on file  . Alcohol Use: Yes     occ      Review of Systems  Respiratory: Negative for shortness of breath.   Gastrointestinal: Positive for abdominal pain and diarrhea. Negative for nausea and vomiting.       Has had diarrhea since starting the antibiotic  Genitourinary: Positive for dysuria, frequency, flank pain and difficulty urinating.  Neurological: Negative for weakness.  All other systems reviewed and are negative.    Allergies  Review of patient's allergies indicates no known allergies.  Home Medications   Current Outpatient Rx  Name Route Sig Dispense Refill  .  ACETAMINOPHEN ER 650 MG PO TBCR Oral Take 1,300 mg by mouth 2 (two) times daily.      . ASPIRIN EC 81 MG PO TBEC Oral Take 81 mg by mouth daily.      Marland Kitchen CALCIUM CARBONATE-VITAMIN D 500-200 MG-UNIT PO TABS Oral Take 1 tablet by mouth 2 (two) times daily.      Marland Kitchen VITAMIN D 1000 UNITS PO TABS Oral Take 1,000 Units by mouth daily.      Marland Kitchen DOXAZOSIN MESYLATE 8 MG PO TABS Oral Take 8 mg by mouth at bedtime.      Marland Kitchen GEMFIBROZIL 600 MG PO TABS Oral Take 600 mg by mouth 2 (two) times daily before a meal.      . LISINOPRIL 10 MG PO TABS Oral Take 10 mg by mouth daily.      . OCUVITE-LUTEIN PO CAPS Oral Take 2 capsules by mouth daily.      Marland Kitchen OMEPRAZOLE 20 MG PO CPDR Oral Take 20 mg by mouth daily as needed. For acid reflux       BP 99/49  Pulse 119  Temp(Src) 97.9 F (36.6 C) (Oral)  Resp 19  Ht 6' (1.829 m)  Wt 225 lb (102.059 kg)  BMI 30.52 kg/m2  SpO2 99%  Physical Exam  Nursing note and vitals reviewed. Constitutional: He is oriented to person, place, and time. He appears well-developed and well-nourished. No distress.  HENT:  Head: Normocephalic and atraumatic.  Mouth/Throat: Oropharynx is clear and moist.       Hearing aids bilaterally  Eyes: Conjunctivae and EOM are normal. Pupils  are equal, round, and reactive to light.  Neck: Normal range of motion. Neck supple.  Cardiovascular: Normal rate, regular rhythm and intact distal pulses.   No murmur heard. Pulmonary/Chest: Effort normal and breath sounds normal. No respiratory distress. He has no wheezes. He has no rales.  Abdominal: Soft. He exhibits no distension. There is tenderness in the suprapubic area. There is no rebound, no guarding and no CVA tenderness.       Mild suprapubic tenderness  Genitourinary:       A Foley catheter present draining yellow urine with a 1.1 L of urine in catheter bag. a  Musculoskeletal: Normal range of motion. He exhibits no edema and no tenderness.  Neurological: He is alert and oriented to person,  place, and time.  Skin: Skin is warm and dry. No rash noted. No erythema.  Psychiatric: He has a normal mood and affect. His behavior is normal.    ED Course  Procedures (including critical care time)   Labs Reviewed  URINALYSIS, ROUTINE W REFLEX MICROSCOPIC  URINE CULTURE   Dg Chest 2 View  04/19/2011  *RADIOLOGY REPORT*  Clinical Data: Sharp upper back pain and pain over the left scapula for the past week.  CHEST - 2 VIEW  Comparison: 03/10/2009.  Findings: Poor inspiration.  No gross change in borderline enlargement of the cardiac silhouette.  Minimal bibasilar atelectasis.  Otherwise, clear lungs.  Extensive thoracic spine and bilateral shoulder degenerative changes.  IMPRESSION:  1.  Poor inspiration with minimal bibasilar atelectasis. 2.  Extensive thoracic spine and bilateral shoulder degenerative changes.  Original Report Authenticated By: Darrol Angel, M.D.     No diagnosis found.    MDM   Patient presenting with symptoms of urinary retention. He was seen by urology 2 days ago and was diagnosed with urinary tract infection at that time and placed on Cipro after getting IM injections of Rocephin. Patient states since getting started on the antibiotic he has not gotten any better and from last night has been unable to urinate at all. Foley catheter was placed the patient arrived and drained 1.1 L of urine. Patient denies any symptoms of pyelonephritis. No fever, vomiting, or flank pain. New UA sent here and culture. Currently trying to contact [facility] to see if a culture was done and if the Cipro is covering his infection.     Spoke with [facility] and a urine culture was not done when he was in the office on Tuesday. send a urine culture here however the UA today only shows 3-6 white blood cells will will have him continue Cipro until culture results return. [facility] a week or. Will send patient home with a leg bag.  Gwyneth Sprout, MD 04/21/11  0830

## 2011-04-21 NOTE — ED Notes (Signed)
Pt does not have PIV. Discontinued on 11/27 prior to discharge. No IV present on arrival

## 2011-04-21 NOTE — ED Notes (Signed)
Pt states difficulty urinating that started Sunday-states having blood clots on Monday-called urology and was seen Tuesday-did an in and out cath and states had a "bad" UTI-states has been on cipro-states has been dribbling since-states also noted he had a low BP and elevated pulse and was sent to [facility] they checked me out and put some saline in me and states his BP came up to normal-here this AM for the urinary dribbling-states having severe bladder spasms and states just dribbling or not urinating

## 2011-04-22 LAB — URINE CULTURE
Colony Count: NO GROWTH
Culture  Setup Time: 201211291409

## 2011-04-23 ENCOUNTER — Emergency Department (HOSPITAL_COMMUNITY): Payer: Medicare Other

## 2011-04-23 ENCOUNTER — Encounter (HOSPITAL_COMMUNITY): Payer: Self-pay | Admitting: Family Medicine

## 2011-04-23 ENCOUNTER — Emergency Department (HOSPITAL_COMMUNITY)
Admission: EM | Admit: 2011-04-23 | Discharge: 2011-04-23 | Disposition: A | Discharge status: Home / Self Care | Payer: Medicare Other | Attending: Emergency Medicine | Admitting: Emergency Medicine

## 2011-04-23 ENCOUNTER — Encounter (HOSPITAL_COMMUNITY): Payer: Self-pay | Admitting: Emergency Medicine

## 2011-04-23 DIAGNOSIS — R109 Unspecified abdominal pain: Secondary | ICD-10-CM | POA: Insufficient documentation

## 2011-04-23 DIAGNOSIS — R000 Tachycardia, unspecified: Secondary | ICD-10-CM | POA: Insufficient documentation

## 2011-04-23 DIAGNOSIS — R10819 Abdominal tenderness, unspecified site: Secondary | ICD-10-CM | POA: Insufficient documentation

## 2011-04-23 DIAGNOSIS — N3289 Other specified disorders of bladder: Secondary | ICD-10-CM | POA: Insufficient documentation

## 2011-04-23 DIAGNOSIS — Z8546 Personal history of malignant neoplasm of prostate: Secondary | ICD-10-CM | POA: Insufficient documentation

## 2011-04-23 DIAGNOSIS — Z923 Personal history of irradiation: Secondary | ICD-10-CM | POA: Insufficient documentation

## 2011-04-23 DIAGNOSIS — R339 Retention of urine, unspecified: Secondary | ICD-10-CM

## 2011-04-23 DIAGNOSIS — I10 Essential (primary) hypertension: Secondary | ICD-10-CM | POA: Insufficient documentation

## 2011-04-23 DIAGNOSIS — R011 Cardiac murmur, unspecified: Secondary | ICD-10-CM | POA: Insufficient documentation

## 2011-04-23 LAB — URINALYSIS, ROUTINE W REFLEX MICROSCOPIC
Protein, ur: 100 mg/dL — AB
Urobilinogen, UA: 0.2 mg/dL (ref 0.0–1.0)

## 2011-04-23 LAB — DIFFERENTIAL
Eosinophils Relative: 1 % (ref 0–5)
Lymphocytes Relative: 10 % — ABNORMAL LOW (ref 12–46)
Lymphs Abs: 1.4 10*3/uL (ref 0.7–4.0)
Monocytes Absolute: 0.8 10*3/uL (ref 0.1–1.0)

## 2011-04-23 LAB — CBC
HCT: 30.4 % — ABNORMAL LOW (ref 39.0–52.0)
MCV: 91 fL (ref 78.0–100.0)
Platelets: 388 10*3/uL (ref 150–400)
RBC: 3.34 MIL/uL — ABNORMAL LOW (ref 4.22–5.81)
WBC: 13.3 10*3/uL — ABNORMAL HIGH (ref 4.0–10.5)

## 2011-04-23 LAB — BASIC METABOLIC PANEL
Calcium: 10.4 mg/dL (ref 8.4–10.5)
GFR calc Af Amer: >90 mL/min (ref >90)
GFR calc non Af Amer: 79 mL/min — ABNORMAL LOW (ref >90)
Glucose, Bld: 150 mg/dL — ABNORMAL HIGH (ref 70–99)
Sodium: 135 mEq/L (ref 135–145)

## 2011-04-23 LAB — URINE MICROSCOPIC-ADD ON

## 2011-04-23 MED ORDER — HYDROCODONE-ACETAMINOPHEN 5-325 MG PO TABS: 1.0000 | ORAL_TABLET | Freq: Three times a day (TID) | ORAL | Status: AC | PRN

## 2011-04-23 NOTE — ED Provider Notes (Signed)
History     CSN: [id number] Arrival date & time: 04/23/2011  7:09 AM   First MD Initiated Contact with Patient 04/23/11 [phone]      Chief Complaint  Patient presents with  . Spasms    bladder pain  . Urinary Retention  . Hematuria    (Consider location/radiation/quality/duration/timing/severity/associated sxs/prior treatment) Patient is a 76 y.o. male presenting with hematuria. The history is provided by the patient.  Hematuria Associated symptoms include abdominal pain. Pertinent negatives include no chills, fever, nausea or vomiting.   this patient is a 77 year old male, with a history of prostate cancer that was treated with radiation who presents to the emergency department complaining of bladder obstruction.  Last week.  He had a Foley catheter placed by his urologist because he had urinary retention.  Subsequently, the catheter, developed, blood clots, and he was unable to pass urine, so a larger Foley catheter was placed last night.  He had further blood clots and had to return to the emergency department where he was irrigated and flow was achieved (she is discharged from the emergency department since he went home.  He had recurrence of lower normal.  Pain without any urine output from his Foley so he returned.  The emergency department.  He denies nausea, vomiting, fevers, chills, or any other symptoms  Past Medical History  Diagnosis Date  . Hypertension   . Prostate cancer   . GERD (gastroesophageal reflux disease)   . Hypercholesteremia     Past Surgical History  Procedure Date  . Colon surgery     History reviewed. No pertinent family history.  History  Substance Use Topics  . Smoking status: Never Smoker   . Smokeless tobacco: Not on file  . Alcohol Use: Yes     occ      Review of Systems  Constitutional: Negative for fever and chills.  HENT: Negative for congestion.   Gastrointestinal: Positive for abdominal pain and abdominal distention. Negative for  nausea and vomiting.  Genitourinary: Positive for hematuria.  Skin: Negative for rash.  Neurological: Negative for headaches.  Psychiatric/Behavioral: Negative for confusion.    Allergies  Review of patient's allergies indicates no known allergies.  Home Medications   Current Outpatient Rx  Name Route Sig Dispense Refill  . ACETAMINOPHEN ER 650 MG PO TBCR Oral Take 1,300 mg by mouth 2 (two) times daily as needed. For pain.    . ASPIRIN EC 81 MG PO TBEC Oral Take 81 mg by mouth daily.      Marland Kitchen VITAMIN D 1000 UNITS PO TABS Oral Take 1,000 Units by mouth daily.      Marland Kitchen CIPROFLOXACIN HCL 500 MG PO TABS Oral Take 500 mg by mouth every 12 (twelve) hours.      . DOCUSATE SODIUM 100 MG PO CAPS Oral Take 100-200 mg by mouth 2 (two) times daily as needed. For constipation     . DOXAZOSIN MESYLATE 8 MG PO TABS Oral Take 8 mg by mouth daily.     Marland Kitchen GEMFIBROZIL 600 MG PO TABS Oral Take 600 mg by mouth 2 (two) times daily before a meal.      . HYDROCODONE-ACETAMINOPHEN 5-325 MG PO TABS Oral Take 1 tablet by mouth every 8 (eight) hours as needed for pain. 15 tablet 0  . LISINOPRIL 10 MG PO TABS Oral Take 10 mg by mouth daily.      . OCUVITE-LUTEIN PO CAPS Oral Take 2 capsules by mouth daily.      Marland Kitchen  NAPROXEN SODIUM 220 MG PO TABS Oral Take 220 mg by mouth as needed. For pain     . OMEPRAZOLE 20 MG PO CPDR Oral Take 20 mg by mouth daily as needed. For acid reflux     . TROSPIUM CHLORIDE 20 MG PO TABS Oral Take 20 mg by mouth daily.        BP 127/76  Pulse 94  Temp(Src) 98.2 F (36.8 C) (Oral)  Resp 20  SpO2 95%  Physical Exam  Constitutional: He is oriented to person, place, and time. He appears well-developed and well-nourished. No distress.  HENT:  Head: Normocephalic and atraumatic.  Eyes: EOM are normal. Pupils are equal, round, and reactive to light.  Neck: Normal range of motion. Neck supple.  Abdominal: Soft. Bowel sounds are normal. He exhibits no distension and no mass. There is  tenderness. There is no rebound and no guarding.  Musculoskeletal: Normal range of motion. He exhibits no edema and no tenderness.  Neurological: He is alert and oriented to person, place, and time. No cranial nerve deficit.  Skin: Skin is warm and dry. He is not diaphoretic.  Psychiatric: He has a normal mood and affect. His behavior is normal.    ED Course  Procedures (including critical care time)  18 French Foley catheter, irrigated once again.  Subsequently, approximately 1100 cc of dark, amber colored urine was removed.  He feels much better.  We will replace his Foley with a new Foley, which is one size larger.    9:49 AM The nurse successfully placed a 20 French Foley catheter in the patient.  Dark urine.  Immediately drained.  The leg bag was reattached.  We'll let him go home with a larger Foley and leg bag.   MDM  Urinary retention        Nicholes Stairs, MD 04/23/11 [phone]

## 2011-04-23 NOTE — ED Notes (Signed)
#  20 french foley inserted pt tolerated pink clear urine one lg clot noted at the end of old cath

## 2011-04-23 NOTE — ED Notes (Signed)
Foley catheter irrigated with 30ml saline without difficulty. Immediate return of dark, bloody urine. No clots noted. Urine output of 900 ml emptied from leg bag. Will continue to monitor output.

## 2011-04-23 NOTE — ED Provider Notes (Signed)
History     CSN: [id number] Arrival date & time: 04/23/2011 12:15 AM   First MD Initiated Contact with Patient 04/23/11 0041      Chief Complaint  Patient presents with  . Urinary Retention    (Consider location/radiation/quality/duration/timing/severity/associated sxs/prior treatment) HPI The patient presents with suprapubic pain and concerns of urinary retention. Notably the patient was diagnosed with a urinary tract infection 3 days ago, and has a Foley catheter in place. He notes over the past 3 days he seen his urologist, and been to the ED multiple times, due to recurrent difficulties urinating. He notes that approximately 4 hours ago he noticed the sensation of urine production. Since that time he is increasing discomfort in his suprapubic region. The pain is otherwise non-radiating, and it is described as pressure like with occasional crampy episodes. The patient has not achieved relief with medications, notes the pain is worsening continuously. No fever, no chills, no nausea, vomiting, no diarrhea. Past Medical History  Diagnosis Date  . Hypertension   . Prostate cancer   . GERD (gastroesophageal reflux disease)   . Hypercholesteremia     Past Surgical History  Procedure Date  . Colon surgery     No family history on file.  History  Substance Use Topics  . Smoking status: Never Smoker   . Smokeless tobacco: Not on file  . Alcohol Use: Yes     occ      Review of Systems  All other systems reviewed and are negative.    Allergies  Review of patient's allergies indicates no known allergies.  Home Medications   Current Outpatient Rx  Name Route Sig Dispense Refill  . ACETAMINOPHEN ER 650 MG PO TBCR Oral Take 1,300 mg by mouth 2 (two) times daily as needed. For pain.    . ASPIRIN EC 81 MG PO TBEC Oral Take 81 mg by mouth daily.      Marland Kitchen VITAMIN D 1000 UNITS PO TABS Oral Take 1,000 Units by mouth daily.      Marland Kitchen CIPROFLOXACIN HCL 500 MG PO TABS Oral Take 500 mg  by mouth every 12 (twelve) hours.      . DOCUSATE SODIUM 100 MG PO CAPS Oral Take 100-200 mg by mouth 2 (two) times daily as needed. For constipation     . DOXAZOSIN MESYLATE 8 MG PO TABS Oral Take 8 mg by mouth daily.     Marland Kitchen GEMFIBROZIL 600 MG PO TABS Oral Take 600 mg by mouth 2 (two) times daily before a meal.      . LISINOPRIL 10 MG PO TABS Oral Take 10 mg by mouth daily.      . OCUVITE-LUTEIN PO CAPS Oral Take 2 capsules by mouth daily.      Marland Kitchen NAPROXEN SODIUM 220 MG PO TABS Oral Take 220 mg by mouth as needed. For pain     . OMEPRAZOLE 20 MG PO CPDR Oral Take 20 mg by mouth daily as needed. For acid reflux     . TROSPIUM CHLORIDE 20 MG PO TABS Oral Take 20 mg by mouth daily.        BP 111/56  Pulse 110  Temp(Src) 97.6 F (36.4 C) (Oral)  Resp 16  SpO2 98%  Physical Exam  Constitutional: He is oriented to person, place, and time. He appears well-developed and well-nourished. No distress.  HENT:  Head: Normocephalic and atraumatic.  Eyes: Conjunctivae and EOM are normal.  Cardiovascular: Regular rhythm.  Tachycardia present.   Murmur heard.  Pulmonary/Chest: Effort normal and breath sounds normal.  Abdominal: He exhibits distension. There is tenderness. There is guarding.  Genitourinary: Penis normal.       Foley catheter in place with no active draining into the leg bag  Musculoskeletal: He exhibits no edema and no tenderness.  Neurological: He is oriented to person, place, and time.  Skin: Skin is warm and dry.    ED Course  Procedures (including critical care time)  Labs Reviewed - No data to display No results found.   No diagnosis found.  1:55 AM Following irrigation of his bladder the patient felt significantly better. The nurse notes 900 mL of dark urine was drained immediately after irrigation.  MDM  This patient with history of prostate cancer, prior strictures now presents with urinary retention. Following irrigation the patient noted resolution of his  complaints. Absent evidence of systemic infection, with resolution of the patient's complaints, and with the catheter already in place and access to ready followup, the patient will be discharged.        Gerhard Munch, MD 04/23/11 [phone]

## 2011-04-23 NOTE — ED Notes (Signed)
Pt was seen in the ER earlier today, pt went home and states that s/s did not improve, pt not able to completely empty his bladder, spasms present, bladder pain 9/10 at this time, notes in the leg bag red fluid collection, foley that is placed on Thursday, placed in the PCP office, while in the ER last night foley irrigated and bladder emptied, pain stopped, however, symptoms are back

## 2011-04-23 NOTE — ED Notes (Signed)
Patient states that he had a foley catheter inserted Thursday morning, now catheter not draining.

## 2011-04-23 NOTE — ED Notes (Signed)
OZH:[id number]<HQ> Expected date:<BR> Expected time:<BR> Means of arrival:<BR> Comments:<BR> ZONE CLOSED

## 2011-04-23 NOTE — ED Notes (Signed)
Pt called nurse spasm improved voided 1100cc via foley tea colored urine

## 2011-04-24 ENCOUNTER — Encounter (HOSPITAL_COMMUNITY): Payer: Self-pay

## 2011-04-24 ENCOUNTER — Emergency Department (HOSPITAL_COMMUNITY)
Admission: EM | Admit: 2011-04-24 | Discharge: 2011-04-24 | Disposition: A | Discharge status: Home / Self Care | Payer: Medicare Other | Attending: Emergency Medicine | Admitting: Emergency Medicine

## 2011-04-24 DIAGNOSIS — Y846 Urinary catheterization as the cause of abnormal reaction of the patient, or of later complication, without mention of misadventure at the time of the procedure: Secondary | ICD-10-CM | POA: Insufficient documentation

## 2011-04-24 DIAGNOSIS — Z8546 Personal history of malignant neoplasm of prostate: Secondary | ICD-10-CM | POA: Insufficient documentation

## 2011-04-24 DIAGNOSIS — R10819 Abdominal tenderness, unspecified site: Secondary | ICD-10-CM | POA: Insufficient documentation

## 2011-04-24 DIAGNOSIS — T83091A Other mechanical complication of indwelling urethral catheter, initial encounter: Secondary | ICD-10-CM

## 2011-04-24 DIAGNOSIS — T8389XA Other specified complication of genitourinary prosthetic devices, implants and grafts, initial encounter: Secondary | ICD-10-CM | POA: Insufficient documentation

## 2011-04-24 DIAGNOSIS — I10 Essential (primary) hypertension: Secondary | ICD-10-CM | POA: Insufficient documentation

## 2011-04-24 NOTE — ED Provider Notes (Signed)
Medical screening examination/treatment/procedure(s) were performed by non-physician practitioner and as supervising physician I was immediately available for consultation/collaboration.   Celene Kras, MD 04/24/11 [phone]

## 2011-04-24 NOTE — ED Notes (Signed)
Patient reports that he has a catheter placed for approximately 1 week and has had some clotting with cath replacements throughout the week.  He is being followed by urology and today he has noted that he has not had urine in the bag and is now beginning to have spasms.

## 2011-04-24 NOTE — ED Provider Notes (Signed)
History     CSN: [id number] Arrival date & time: 04/24/2011  7:18 PM   First MD Initiated Contact with Patient 04/24/11 2108      Chief Complaint  Patient presents with  . Spasms    (Consider location/radiation/quality/duration/timing/severity/associated sxs/prior treatment) The history is provided by the patient.    Pt presented to the ED with complaints of his foley catheter not draining. The patient states that he is a patient of Dr. Margarita Grizzle who he is to see this Thursday. Pt has been in the ED 4 times in the past 4 weeks and is on Cipro for his known UTI and given a shot of Rocephin at one of his recent visits. The patient says taht when his bag gets full he experiences pain. Pt denies fever, chills, nausea, vomiting, diarrhea.    Past Medical History  Diagnosis Date  . Hypertension   . Prostate cancer   . GERD (gastroesophageal reflux disease)   . Hypercholesteremia     Past Surgical History  Procedure Date  . Colon surgery     History reviewed. No pertinent family history.  History  Substance Use Topics  . Smoking status: Never Smoker   . Smokeless tobacco: Not on file  . Alcohol Use: Yes     occ      Review of Systems  All other systems reviewed and are negative.    Allergies  Review of patient's allergies indicates no known allergies.  Home Medications   Current Outpatient Rx  Name Route Sig Dispense Refill  . ACETAMINOPHEN ER 650 MG PO TBCR Oral Take 1,300 mg by mouth 2 (two) times daily as needed. For pain.    . ASPIRIN EC 81 MG PO TBEC Oral Take 81 mg by mouth daily.      Marland Kitchen VITAMIN D 1000 UNITS PO TABS Oral Take 1,000 Units by mouth daily.      Marland Kitchen CIPROFLOXACIN HCL 500 MG PO TABS Oral Take 500 mg by mouth every 12 (twelve) hours. Course started 04/19/11 for 14 day supply    . DOCUSATE SODIUM 100 MG PO CAPS Oral Take 100-200 mg by mouth 2 (two) times daily as needed. For constipation     . DOXAZOSIN MESYLATE 8 MG PO TABS Oral Take 8 mg by mouth  daily.     Marland Kitchen GEMFIBROZIL 600 MG PO TABS Oral Take 600 mg by mouth 2 (two) times daily before a meal.      . HYDROCODONE-ACETAMINOPHEN 5-325 MG PO TABS Oral Take 1 tablet by mouth every 8 (eight) hours as needed for pain. 15 tablet 0  . LISINOPRIL 10 MG PO TABS Oral Take 10 mg by mouth daily.      . ICAPS LUTEIN & OMEGA-3 PO CAPS Oral Take 2 capsules by mouth daily.      Marland Kitchen OMEPRAZOLE 20 MG PO CPDR Oral Take 20 mg by mouth daily as needed. For acid reflux       BP 109/60  Pulse 92  Temp(Src) 97.6 F (36.4 C) (Oral)  Resp 18  Wt 219 lb 9.6 oz (99.61 kg)  SpO2 98%  Physical Exam  Nursing note and vitals reviewed. Constitutional: He is oriented to person, place, and time. He appears well-developed and well-nourished.  HENT:  Head: Normocephalic and atraumatic.  Eyes: EOM are normal. Pupils are equal, round, and reactive to light.  Neck: Normal range of motion.  Cardiovascular: Normal rate and regular rhythm.   Pulmonary/Chest: Effort normal and breath sounds normal.  Abdominal:  Soft. Bowel sounds are normal. He exhibits distension (pt has a foley bag that is draining urine). There is tenderness.  Musculoskeletal: Normal range of motion.  Neurological: He is alert and oriented to person, place, and time.  Skin: Skin is warm and dry.    ED Course  Procedures (including critical care time)  Labs Reviewed - No data to display No results found.   No diagnosis found.    MDM  Pt being taught how to irrigate foley bag at home. Pt is to attend appointment with Dr. Margarita Grizzle on Thursday.        Dorthula Matas, PA 04/24/11 2120  Dorthula Matas, PA 04/24/11 2125

## 2011-04-24 NOTE — ED Notes (Signed)
Irrigated Foley noted large clot flushed easily urine straw colored running freely after irrigation.  Pt leg bag dry before irrigation.

## 2011-12-27 ENCOUNTER — Emergency Department (HOSPITAL_COMMUNITY)
Admission: EM | Admit: 2011-12-27 | Discharge: 2011-12-27 | Disposition: A | Discharge status: Home / Self Care | Payer: Medicare Other | Attending: Emergency Medicine | Admitting: Emergency Medicine

## 2011-12-27 ENCOUNTER — Encounter (HOSPITAL_COMMUNITY): Payer: Self-pay | Admitting: *Deleted

## 2011-12-27 DIAGNOSIS — N390 Urinary tract infection, site not specified: Secondary | ICD-10-CM

## 2011-12-27 DIAGNOSIS — Z8546 Personal history of malignant neoplasm of prostate: Secondary | ICD-10-CM | POA: Insufficient documentation

## 2011-12-27 DIAGNOSIS — K219 Gastro-esophageal reflux disease without esophagitis: Secondary | ICD-10-CM | POA: Insufficient documentation

## 2011-12-27 DIAGNOSIS — E7800 Pure hypercholesterolemia, unspecified: Secondary | ICD-10-CM | POA: Insufficient documentation

## 2011-12-27 DIAGNOSIS — I10 Essential (primary) hypertension: Secondary | ICD-10-CM | POA: Insufficient documentation

## 2011-12-27 DIAGNOSIS — R339 Retention of urine, unspecified: Secondary | ICD-10-CM | POA: Insufficient documentation

## 2011-12-27 HISTORY — DX: Polyp of colon: K63.5

## 2011-12-27 LAB — POCT I-STAT, CHEM 8
BUN: 28 mg/dL — ABNORMAL HIGH (ref 6–23)
Calcium, Ion: 1.25 mmol/L (ref 1.13–1.30)
Chloride: 106 mEq/L (ref 96–112)
Sodium: 137 mEq/L (ref 135–145)

## 2011-12-27 LAB — URINALYSIS, ROUTINE W REFLEX MICROSCOPIC
Glucose, UA: NEGATIVE mg/dL
Ketones, ur: NEGATIVE mg/dL
Leukocytes, UA: NEGATIVE
pH: 6 (ref 5.0–8.0)

## 2011-12-27 LAB — URINE MICROSCOPIC-ADD ON

## 2011-12-27 MED ORDER — CIPROFLOXACIN IN D5W 400 MG/200ML IV SOLN
400.0000 mg | Freq: Once | INTRAVENOUS | Status: AC
  Administered 2011-12-27: 400 mg via INTRAVENOUS
  Filled 2011-12-27: qty 200

## 2011-12-27 MED ORDER — MORPHINE SULFATE 4 MG/ML IJ SOLN
4.0000 mg | Freq: Once | INTRAMUSCULAR | Status: AC
  Administered 2011-12-27: 4 mg via INTRAVENOUS
  Filled 2011-12-27: qty 1

## 2011-12-27 NOTE — ED Notes (Signed)
Pt with hx of prostate ca to ED c/o urinary retention.  He had seen his urologist Mon., Natalia Leatherwood, who gave him antibiotics for a bladder infection.  Pt taking cipro.  However, he has been unable to void since 10:30 pm Mon.  PT c/o suprapubic pain.

## 2011-12-27 NOTE — ED Notes (Signed)
PA unsuccessful in inserting caude. MD notified. Awaiting new orders. Will continue to monitor pt.

## 2011-12-27 NOTE — ED Provider Notes (Signed)
History     CSN: [id number]  Arrival date & time 12/27/11  0317   First MD Initiated Contact with Patient 12/27/11 [phone]      Chief Complaint  Patient presents with  . Urinary Retention    (Consider location/radiation/quality/duration/timing/severity/associated sxs/prior treatment) The history is provided by the patient. No language interpreter was used.   Cc: 78 yo male Here today c/o urinary retention x 6 hours.  States that all weekend he had a weak stream and was dribbling.  He went to Dr. Margarita Grizzle this am and was started on cipro for a UTI.  Patient was never able to go to sleep last pm and was unable to go to the bathroom. Bedside bladder scan shows 999cc. States that he has had to have a catheter before.  Patient is having bladder spasms.  Attempting  to put a urinary catheter in to relieve the pressure. pmh hypertension, prostate cancer gerd. VSS afebrile.   Past Medical History  Diagnosis Date  . Hypertension   . Prostate cancer   . GERD (gastroesophageal reflux disease)   . Hypercholesteremia   . Colon polyps     Past Surgical History  Procedure Date  . Colon surgery     History reviewed. No pertinent family history.  History  Substance Use Topics  . Smoking status: Never Smoker   . Smokeless tobacco: Not on file  . Alcohol Use: Yes     occ      Review of Systems  Constitutional: Negative.  Negative for fever.  HENT: Negative.   Eyes: Negative.   Respiratory: Negative.   Cardiovascular: Negative.   Gastrointestinal: Negative for nausea and vomiting.       Suprapubic pain.  Genitourinary: Positive for urgency, decreased urine volume and difficulty urinating. Negative for scrotal swelling and testicular pain.  Musculoskeletal: Negative for back pain.  Neurological: Negative.  Negative for dizziness, weakness and headaches.  Psychiatric/Behavioral: Negative.   All other systems reviewed and are negative.    Allergies  Review of patient's allergies  indicates no known allergies.  Home Medications   Current Outpatient Rx  Name Route Sig Dispense Refill  . ASPIRIN EC 81 MG PO TBEC Oral Take 81 mg by mouth daily.      Marland Kitchen VITAMIN D 1000 UNITS PO TABS Oral Take 1,000 Units by mouth daily.      Marland Kitchen DOCUSATE SODIUM 100 MG PO CAPS Oral Take 100-200 mg by mouth 2 (two) times daily as needed. For constipation     . DOXAZOSIN MESYLATE 8 MG PO TABS Oral Take 8 mg by mouth daily.     Marland Kitchen GEMFIBROZIL 600 MG PO TABS Oral Take 600 mg by mouth 2 (two) times daily before a meal.      . LISINOPRIL 10 MG PO TABS Oral Take 10 mg by mouth daily.      . ICAPS LUTEIN & OMEGA-3 PO CAPS Oral Take 2 capsules by mouth daily.      Marland Kitchen RANITIDINE HCL 150 MG PO TABS Oral Take 150 mg by mouth 2 (two) times daily.      BP 135/68  Pulse 115  Temp 98.2 F (36.8 C) (Oral)  Resp 18  SpO2 96%  Physical Exam  Nursing note and vitals reviewed. Constitutional: He is oriented to person, place, and time. He appears well-developed and well-nourished.  HENT:  Head: Normocephalic.  Eyes: Conjunctivae and EOM are normal. Pupils are equal, round, and reactive to light.  Neck: Normal range of motion.  Neck supple.  Cardiovascular: Normal rate.   Pulmonary/Chest: Effort normal and breath sounds normal.  Abdominal: Soft.  Genitourinary: Testes normal and penis normal. No penile tenderness. No discharge found.  Musculoskeletal: Normal range of motion.  Neurological: He is alert and oriented to person, place, and time.  Skin: Skin is warm and dry.  Psychiatric: He has a normal mood and affect.    ED Course  Procedures (including critical care time) Unsuccessfull urinary catheter x 3 in the ER.   Labs Reviewed  POCT I-STAT, CHEM 8 - Abnormal; Notable for the following:    BUN 28 (*)     Glucose, Bld 142 (*)     Hemoglobin 11.9 (*)     HCT 35.0 (*)     All other components within normal limits  URINALYSIS, ROUTINE W REFLEX MICROSCOPIC  URINE CULTURE   No results  found.   No diagnosis found.    MDM  Urinary retention with UTI.  # 14 coude catheter placed by Dr. Dierdre Highman with leg bag.  Will wear until follow up in 10 days.  Dr. Margarita Grizzle consulted.  IV cipro given in the ER.  He will continue PO cipro.  Follow up with Dr. Margarita Grizzle in 7-10 days.  Make appointment tomorrow.  Afebrile vss. Mso4 4mg  for pain in the ER.  Pain 0/10 presently.          Remi Haggard, NP 12/28/11 1139

## 2011-12-27 NOTE — ED Notes (Signed)
14 Fr Caude inserted by MD. Initial UOP 400 mL, light yellow urine with no visible sediment. Urine sample obtained and sent to lab at this time. Pt remains on cardiac monitor, nad at this time and pt's wife at bedside. Will continue to monitor pt.

## 2011-12-27 NOTE — ED Provider Notes (Signed)
PROCEDURE: urinarycatheter placement. Indication - urinary retention and multiple RN attempts unable to pass foley.  Consent obtained. Time out performed. 14 F Cu De catheter placed steriley without difficulty and clear UOP obtained and sent for Urinalysis. PT tolerated well and symptomatically improving after placement.   Sunnie Nielsen, MD 12/27/11 [phone]

## 2011-12-27 NOTE — ED Notes (Signed)
Second RN attempt to insert foley catheter unsuccessful. MD notified. Awaiting new orders. Will continue to monitor pt.

## 2011-12-27 NOTE — ED Notes (Signed)
MD at bedside. 

## 2011-12-27 NOTE — ED Notes (Signed)
Attempted to insert foley catheter per MD orders. Unsuccessful on two attempts. MD notified. Received orders to allow second nurse to attempt foley catheter insertion per MD orders. If unsuccessful, MD will insert caude catheter per MD.

## 2011-12-30 NOTE — ED Provider Notes (Signed)
Medical screening examination/treatment/procedure(s) were conducted as a shared visit with non-physician practitioner(s) and myself.  I personally evaluated the patient during the encounter. Suprapubic tenderness and fullness c/w urinary retention.  I placed coude catheter and PT had sig relief of symptoms with good UOP.  Urology c/s and f/u.   Sunnie Nielsen, MD 12/30/11 [phone]

## 2012-04-06 ENCOUNTER — Other Ambulatory Visit (HOSPITAL_COMMUNITY): Payer: Self-pay | Admitting: Cardiovascular Disease

## 2012-04-06 DIAGNOSIS — I70219 Atherosclerosis of native arteries of extremities with intermittent claudication, unspecified extremity: Secondary | ICD-10-CM

## 2012-04-26 ENCOUNTER — Encounter (HOSPITAL_COMMUNITY): Payer: Medicare Other

## 2012-05-18 ENCOUNTER — Ambulatory Visit (HOSPITAL_COMMUNITY)
Admission: RE | Admit: 2012-05-18 | Discharge: 2012-05-18 | Disposition: A | Discharge status: Home / Self Care | Payer: Medicare Other | Source: Ambulatory Visit | Attending: Cardiovascular Disease | Admitting: Cardiovascular Disease

## 2012-05-18 DIAGNOSIS — I70219 Atherosclerosis of native arteries of extremities with intermittent claudication, unspecified extremity: Secondary | ICD-10-CM | POA: Insufficient documentation

## 2012-05-18 NOTE — Progress Notes (Signed)
BLE arterial Duplex Completed. Ethan Harrison D  

## 2012-05-23 DIAGNOSIS — I739 Peripheral vascular disease, unspecified: Secondary | ICD-10-CM

## 2012-05-23 HISTORY — PX: OTHER SURGICAL HISTORY: SHX169

## 2012-05-23 HISTORY — DX: Peripheral vascular disease, unspecified: I73.9

## 2012-06-04 ENCOUNTER — Other Ambulatory Visit (HOSPITAL_COMMUNITY): Payer: Self-pay | Admitting: Cardiovascular Disease

## 2012-06-04 DIAGNOSIS — Z01818 Encounter for other preprocedural examination: Secondary | ICD-10-CM

## 2012-06-08 ENCOUNTER — Ambulatory Visit (HOSPITAL_COMMUNITY)
Admission: RE | Admit: 2012-06-08 | Discharge: 2012-06-08 | Disposition: A | Discharge status: Home / Self Care | Payer: Medicare Other | Source: Ambulatory Visit | Attending: Cardiovascular Disease | Admitting: Cardiovascular Disease

## 2012-06-08 DIAGNOSIS — Z01818 Encounter for other preprocedural examination: Secondary | ICD-10-CM

## 2012-06-08 DIAGNOSIS — I739 Peripheral vascular disease, unspecified: Secondary | ICD-10-CM | POA: Insufficient documentation

## 2012-06-08 DIAGNOSIS — Z01810 Encounter for preprocedural cardiovascular examination: Secondary | ICD-10-CM | POA: Insufficient documentation

## 2012-06-08 DIAGNOSIS — Z87891 Personal history of nicotine dependence: Secondary | ICD-10-CM | POA: Insufficient documentation

## 2012-06-08 DIAGNOSIS — I10 Essential (primary) hypertension: Secondary | ICD-10-CM | POA: Insufficient documentation

## 2012-06-08 DIAGNOSIS — R000 Tachycardia, unspecified: Secondary | ICD-10-CM | POA: Insufficient documentation

## 2012-06-08 HISTORY — PX: NM MYOCAR PERF WALL MOTION: HXRAD629

## 2012-06-08 MED ORDER — TECHNETIUM TC 99M SESTAMIBI GENERIC - CARDIOLITE
10.8000 | Freq: Once | INTRAVENOUS | Status: AC | PRN
  Administered 2012-06-08: 11 via INTRAVENOUS

## 2012-06-08 MED ORDER — AMINOPHYLLINE 25 MG/ML IV SOLN
75.0000 mg | Freq: Once | INTRAVENOUS | Status: AC
  Administered 2012-06-08: 75 mg via INTRAVENOUS

## 2012-06-08 MED ORDER — TECHNETIUM TC 99M SESTAMIBI GENERIC - CARDIOLITE
30.2000 | Freq: Once | INTRAVENOUS | Status: AC | PRN
  Administered 2012-06-08: 30.2 via INTRAVENOUS

## 2012-06-08 MED ORDER — REGADENOSON 0.4 MG/5ML IV SOLN
0.4000 mg | Freq: Once | INTRAVENOUS | Status: AC
  Administered 2012-06-08: 0.4 mg via INTRAVENOUS

## 2012-06-08 NOTE — Procedures (Addendum)
[facility] [address] [place] [address] [phone]  Cardiology Nuclear Med Study  Ethan Harrison is a 79 y.o. male     MRN : [id number]     DOB: 23-Jun-1933  Procedure Date: 06/08/2012  Nuclear Med Background Indication for Stress Test:  Evaluation for Ischemia and Surgical Clearance -Atherectomy of occluded (L) SFA History:  No prior cardiac history Cardiac Risk Factors: History of Smoking, Hypertension, Lipids and Obesity, PVD Symptoms:  Tachycardia, claudication   Nuclear Pre-Procedure Caffeine/Decaff Intake:  None NPO After: 5:00am   IV Site: R Hand  IV 0.9% NS with Angio Cath:  22g  Chest Size (in):  48 IV Started by: Bonnita Levan, RN  Height: 6' (1.829 m)  Cup Size: n/a  BMI:  Body mass index is 30.65 kg/(m^2). Weight:  226 lb (102.513 kg)   Tech Comments:  N/A    Nuclear Med Study 1 or 2 day study: 1 day  Stress Test Type:  Lexiscan  Order Authorizing Provider:  Nanetta Batty, MD   Resting Radionuclide: Technetium 46m Sestamibi  Resting Radionuclide Dose: 10.8 mCi   Stress Radionuclide:  Technetium 32m Sestamibi  Stress Radionuclide Dose: 30.2 mCi           Stress Protocol Rest HR: 96 Stress HR: 107  Rest BP: 114/67 Stress BP:80/52  Exercise Time (min): n/a METS: n/a          Dose of Adenosine (mg):  n/a Dose of Lexiscan: 0.4 mg  Dose of Atropine (mg): n/a Dose of Dobutamine: n/a mcg/kg/min (at max HR)  Stress Test Technologist: Ernestene Mention, CCT Nuclear Technologist: Gonzella Lex, CNMT   Rest Procedure:  Myocardial perfusion imaging was performed at rest 45 minutes following the intravenous administration of Technetium 12m Sestamibi. Stress Procedure:  The patient received IV Lexiscan 0.4 mg over 15-seconds.  Technetium 53m Sestamibi injected at 30-seconds.  Due to patient's shortness of breath, dizziness and blood pressure response he was given IV aminophylline 75 mg. Symptoms were resolved.  There were no significant changes with Lexiscan.  Quantitative spect images were obtained after a 45 minute delay.  Transient Ischemic Dilatation (Normal <1.22):  1.08 Lung/Heart Ratio (Normal <0.45):  0.37 QGS EDV:  55 ml QGS ESV:  13 ml LV Ejection Fraction: 76%  Signed by: Scipio Cellar Vear Clock, CNMT  PHYSICIAN INTERPRETATION  Rest ECG: NSR with Anteroseptal Q waves -- suggestive of Anterio Infarct Age Undetermined  Stress ECG: No significant change from baseline ECG  QPS Raw Data Images:  Mild diaphragmatic attenuation; normal left ventricular size. Stress Images:  Normal homogeneous uptake in all areas of the myocardium. Rest Images:  Comparison with the stress images reveals no significant change. Subtraction (SDS):  There is no evidence of scar or ischemia.  Impression Exercise Capacity:  Lexiscan with no exercise. BP Response:  Hypotensive blood pressure response.  SBP dropped from ~120 mmHg to 80 mmHg Clinical Symptoms:  Lightheadedness associated with hypotension ECG Impression:  No significant ECG changes with Lexiscan. LV Wall Motion:  NL LV Function; NL Wall Motion  Comparison with Prior Nuclear Study: No previous nuclear study performed  Overall Impression:  Normal stress nuclear study. Low risk stress nuclear study.   Marykay Lex, MD  06/08/2012 2:41 PM

## 2012-06-18 ENCOUNTER — Encounter (HOSPITAL_COMMUNITY): Payer: Self-pay | Admitting: Pharmacist

## 2012-06-20 ENCOUNTER — Ambulatory Visit
Admission: RE | Admit: 2012-06-20 | Discharge: 2012-06-20 | Disposition: A | Discharge status: Home / Self Care | Payer: Medicare Other | Source: Ambulatory Visit | Attending: Cardiovascular Disease | Admitting: Cardiovascular Disease

## 2012-06-20 ENCOUNTER — Other Ambulatory Visit: Payer: Self-pay | Admitting: Cardiovascular Disease

## 2012-06-20 DIAGNOSIS — Z01811 Encounter for preprocedural respiratory examination: Secondary | ICD-10-CM

## 2012-06-26 ENCOUNTER — Ambulatory Visit (HOSPITAL_COMMUNITY)
Admission: RE | Admit: 2012-06-26 | Discharge: 2012-06-26 | Disposition: A | Discharge status: Home / Self Care | Payer: Medicare Other | Source: Ambulatory Visit | Attending: Cardiovascular Disease | Admitting: Cardiovascular Disease

## 2012-06-26 ENCOUNTER — Encounter (HOSPITAL_COMMUNITY)
Admission: RE | Disposition: A | Discharge status: Home / Self Care | Payer: Self-pay | Source: Ambulatory Visit | Attending: Cardiovascular Disease

## 2012-06-26 DIAGNOSIS — E785 Hyperlipidemia, unspecified: Secondary | ICD-10-CM | POA: Insufficient documentation

## 2012-06-26 DIAGNOSIS — I70219 Atherosclerosis of native arteries of extremities with intermittent claudication, unspecified extremity: Secondary | ICD-10-CM | POA: Insufficient documentation

## 2012-06-26 DIAGNOSIS — I10 Essential (primary) hypertension: Secondary | ICD-10-CM | POA: Insufficient documentation

## 2012-06-26 DIAGNOSIS — I7092 Chronic total occlusion of artery of the extremities: Secondary | ICD-10-CM | POA: Insufficient documentation

## 2012-06-26 HISTORY — PX: OTHER SURGICAL HISTORY: SHX169

## 2012-06-26 SURGERY — ABDOMINAL ANGIOGRAM
Laterality: Bilateral

## 2012-06-26 MED ORDER — ONDANSETRON HCL 4 MG/2ML IJ SOLN: 4.0000 mg | Freq: Four times a day (QID) | INTRAMUSCULAR | Status: DC | PRN

## 2012-06-26 MED ORDER — FENTANYL CITRATE 0.05 MG/ML IJ SOLN
INTRAMUSCULAR | Status: AC
  Filled 2012-06-26: qty 2

## 2012-06-26 MED ORDER — HEPARIN (PORCINE) IN NACL 2-0.9 UNIT/ML-% IJ SOLN
INTRAMUSCULAR | Status: AC
  Filled 2012-06-26: qty 1000

## 2012-06-26 MED ORDER — SODIUM CHLORIDE 0.9 % IJ SOLN: 3.0000 mL | INTRAMUSCULAR | Status: DC | PRN

## 2012-06-26 MED ORDER — DIAZEPAM 5 MG PO TABS
5.0000 mg | ORAL_TABLET | ORAL | Status: AC
  Administered 2012-06-26: 5 mg via ORAL

## 2012-06-26 MED ORDER — MIDAZOLAM HCL 2 MG/2ML IJ SOLN
INTRAMUSCULAR | Status: AC
  Filled 2012-06-26: qty 2

## 2012-06-26 MED ORDER — LIDOCAINE HCL (PF) 1 % IJ SOLN
INTRAMUSCULAR | Status: AC
  Filled 2012-06-26: qty 30

## 2012-06-26 MED ORDER — SODIUM CHLORIDE 0.9 % IV SOLN: INTRAVENOUS | Status: DC

## 2012-06-26 MED ORDER — MORPHINE SULFATE 2 MG/ML IJ SOLN: 2.0000 mg | INTRAMUSCULAR | Status: DC | PRN

## 2012-06-26 MED ORDER — SODIUM CHLORIDE 0.9 % IV SOLN
INTRAVENOUS | Status: DC
  Administered 2012-06-26: 09:00:00 via INTRAVENOUS

## 2012-06-26 MED ORDER — DIAZEPAM 5 MG PO TABS
ORAL_TABLET | ORAL | Status: AC
  Filled 2012-06-26: qty 1

## 2012-06-26 MED ORDER — CILOSTAZOL 50 MG PO TABS
50.0000 mg | ORAL_TABLET | Freq: Two times a day (BID) | ORAL | Status: DC
  Filled 2012-06-26 (×3): qty 1

## 2012-06-26 MED ORDER — ACETAMINOPHEN 325 MG PO TABS: 650.0000 mg | ORAL_TABLET | ORAL | Status: DC | PRN

## 2012-06-26 MED ORDER — CILOSTAZOL 50 MG PO TABS: 50.0000 mg | ORAL_TABLET | Freq: Two times a day (BID) | ORAL | Status: DC

## 2012-06-26 NOTE — H&P (Signed)
  H & P will be scanned in.  Pt was reexamined and existing H & P reviewed. No changes found.  Runell Gess, MD [facility] 06/26/2012 10:37 AM

## 2012-06-26 NOTE — CV Procedure (Signed)
Ethan Harrison is a 79 y.o. male    [id number] LOCATION:  FACILITY: [facility]  PHYSICIAN: Nanetta Batty, M.D. June 25, 1933   DATE OF PROCEDURE:  06/26/2012  DATE OF DISCHARGE:  [facility]  PV Angio    History obtained from chart review. Ethan Harrison is a 79 year old moderately overweight married Caucasian male father of one living child whose wife [name] is also a patient of mine. He self-referred for second opinion because of peripheral vascular disease and lifestyle limiting claudication. His cardiac risk factor profile remarkable for a 50-75 pack year history tobacco abuse having quit 25 years ago, treated hypertension and dyslipidemia. He's never had a heart attack or stroke, and denies chest pain or shortness of breath. He developed claudication type symptoms 2 years ago which is equal bilaterally. Doppler's office revealed ABIs of 0.57 bilaterally with SFA and popliteal disease. He had a negative Myoview stress test. He presents now for angiography and potential percutaneous intervention for lifestyle limiting claudication.   PROCEDURE DESCRIPTION:    The patient was brought to the second floor [facility] cath lab in the postabsorptive state. He was premedicated with Valium 5 mg by mouth, IV Versed and fentanyl.Marland Kitchen His right groin was prepped and shaved in usual sterile fashion. Xylocaine 1% was used for local anesthesia. A 5 French sheath was inserted into the right common femoral artery using standard Seldinger technique. 5 French pigtail catheter as well as enteral catheters were used for abdominal aortography with bilateral runoff using digital subtraction bolus chase step table technique. Visipaque dye was used for the entirety of the case. Retrograde aortic pressure was monitored during the case.   HEMODYNAMICS:    AO SYSTOLIC/AO DIASTOLIC: 121/62    ANGIOGRAPHIC RESULTS:   1: Abdominal aortogram-renal arteries are widely patent. The infrarenal  abdominal aortawas moderately atherosclerotic.  2: Left lower extremity-the left common femoral artery was highly calcified and subtotally occluded. The left SFA was diffusely calcified from its origin and subtotally occluded down to the popliteal artery where it was totally occluded and reconstituted by collaterals in the below the knee popliteal. There was two-vessel runoff of a highly diseased segmental proximal posterior tibial artery.  3: Right lower extremity-there was 80% proximal segmental right SFA stenosis. There is long 90% calcified mid right SFA stenosis as well as a calcified occlusion of the popliteal beginning above the knee and reconstituting below the knee. There is three-vessel runoff with 80% segmental tibioperoneal trunk stenosis.    IMPRESSION:Ethan Harrison has severely calcified bilateral SFA and popliteal disease probably not amenable to percutaneous revascularization. The sheath was removed and pressure was held on the groin to achieve hemostasis. The patient left the Cath Lab in stable condition. He will be gently hydrated for 4 hours and discharged home. I'll see him back in the office in 2 weeks. Medical therapy at this point he recommended.  Runell Gess MD, [facility] 06/26/2012 10:39 AM

## 2012-08-03 ENCOUNTER — Other Ambulatory Visit: Payer: Self-pay

## 2012-08-03 DIAGNOSIS — Z01810 Encounter for preprocedural cardiovascular examination: Secondary | ICD-10-CM

## 2012-08-03 DIAGNOSIS — I739 Peripheral vascular disease, unspecified: Secondary | ICD-10-CM

## 2012-08-10 ENCOUNTER — Encounter: Payer: Self-pay | Admitting: Surgery

## 2012-08-13 ENCOUNTER — Encounter: Payer: Self-pay | Admitting: Surgery

## 2012-08-13 ENCOUNTER — Encounter (INDEPENDENT_AMBULATORY_CARE_PROVIDER_SITE_OTHER): Payer: Medicare Other | Admitting: *Deleted

## 2012-08-13 ENCOUNTER — Ambulatory Visit (INDEPENDENT_AMBULATORY_CARE_PROVIDER_SITE_OTHER): Payer: Medicare Other | Admitting: Surgery

## 2012-08-13 VITALS — BP 102/66 | HR 104 | Resp 18 | Ht 72.0 in | Wt 227.0 lb

## 2012-08-13 DIAGNOSIS — I70219 Atherosclerosis of native arteries of extremities with intermittent claudication, unspecified extremity: Secondary | ICD-10-CM

## 2012-08-13 DIAGNOSIS — I739 Peripheral vascular disease, unspecified: Secondary | ICD-10-CM

## 2012-08-13 DIAGNOSIS — Z01810 Encounter for preprocedural cardiovascular examination: Secondary | ICD-10-CM

## 2012-08-13 MED ORDER — PENTOXIFYLLINE ER 400 MG PO TBCR: 400.0000 mg | EXTENDED_RELEASE_TABLET | Freq: Two times a day (BID) | ORAL | Status: DC

## 2012-08-13 NOTE — Progress Notes (Signed)
[facility]   Patient name: Ethan Harrison MRN: [id number] DOB: 1933-08-19 Sex: male   Referred by: Dr. Allyson Sabal  Reason for referral:  Chief Complaint  Patient presents with  . New Evaluation    Possible  Fem-Pop BPG, Ref. by Dr.  Erlene Quan.  C/O  Bilateral calf pain with walking  duration 2-3 years.    HISTORY OF PRESENT ILLNESS: This is a very pleasant 79 year old general in that was referred to me by Dr. Allyson Sabal for evaluation of bilateral claudication. The patient states that he has been having problems for approximately 2-3 years. He states that he can walk approximately 100 feet before his legs give out. His left leg bothers him more so than the right. He denies rest pain or ulceration. The patient is medically managed for hypertension and hyperlipidemia. He has a history of smoking but quit 25 years ago. He does have a family history of peripheral vascular disease. His father underwent a leg amputation in his 21s.  Past Medical History  Diagnosis Date  . Hypertension   . GERD (gastroesophageal reflux disease)   . Hypercholesteremia   . Colon polyps   . Peripheral vascular disease   . Prostate cancer     Past Surgical History  Procedure Laterality Date  . Colon surgery  2000  . Cholecystectomy      Bladder     History   Social History  . Marital Status: Married    Spouse Name: N/A    Number of Children: N/A  . Years of Education: N/A   Occupational History  . Not on file.   Social History Main Topics  . Smoking status: Former Games developer  . Smokeless tobacco: Never Used  . Alcohol Use: Yes     Comment: occ  . Drug Use: No  . Sexually Active: Not on file   Other Topics Concern  . Not on file   Social History Narrative  . No narrative on file    Family History  Problem Relation Age of Onset  . Deep vein thrombosis Father     Amputation-Left leg  . Hypertension Father   . Hyperlipidemia Father   . Heart disease Father     Heart  Disease before age 9    Allergies as of 08/13/2012  . (No Known Allergies)    Current Outpatient Prescriptions on File Prior to Visit  Medication Sig Dispense Refill  . aspirin EC 81 MG tablet Take 81 mg by mouth daily.       Marland Kitchen atorvastatin (LIPITOR) 10 MG tablet Take 20 mg by mouth daily.       Marland Kitchen doxazosin (CARDURA) 8 MG tablet Take 8 mg by mouth daily.       Marland Kitchen gemfibrozil (LOPID) 600 MG tablet Take 600 mg by mouth 2 (two) times daily before a meal.       . ibuprofen (ADVIL,MOTRIN) 200 MG tablet Take 200 mg by mouth 2 (two) times daily.      Marland Kitchen lisinopril (PRINIVIL,ZESTRIL) 5 MG tablet Take 5 mg by mouth daily.      . Multiple Vitamin (MULTIVITAMIN WITH MINERALS) TABS Take 1 tablet by mouth daily.      . Multiple Vitamins-Minerals (ICAPS PO) Take 2 tablets by mouth daily.      . ranitidine (ZANTAC) 150 MG tablet Take 150 mg by mouth 2 (two) times daily.      . trospium (SANCTURA) 20 MG tablet Take 20 mg by mouth at  bedtime.      . cilostazol (PLETAL) 50 MG tablet Take 1 tablet (50 mg total) by mouth 2 (two) times daily.  60 tablet  5  . docusate sodium (COLACE) 100 MG capsule Take 200 mg by mouth 2 (two) times daily as needed. For constipation       No current facility-administered medications on file prior to visit.     REVIEW OF SYSTEMS: Please see history of present illness, otherwise all systems are negative  PHYSICAL EXAMINATION: General: The patient appears their stated age.  Vital signs are BP 102/66  Pulse 104  Resp 18  Ht 6' (1.829 m)  Wt 227 lb (102.967 kg)  BMI 30.78 kg/m2  SpO2 96% HEENT:  No gross abnormalities Pulmonary: Respirations are non-labored Abdomen: Soft and non-tender . Aorta is nonpalpable Musculoskeletal: There are no major deformities.   Neurologic: No focal weakness or paresthesias are detected, Skin: There are no ulcer or rashes noted. Psychiatric: The patient has normal affect. Cardiovascular: There is a regular rate and rhythm without  significant murmur appreciated. No carotid bruits. Palpable femoral pulses. Pedal pulses are not palpable  Diagnostic Studies: I have reviewed his arteriogram which shows bilateral superficial femoral and popliteal stenosis/occlusion with reconstitution of a below knee popliteal artery.  The patient was vein mapped today. He has adequate greater saphenous vein bilaterally  Outside Studies/Documentation Historical records were reviewed.  They showed bilateral superficial femoral and popliteal artery occlusion  Medication Changes: None  Assessment:  Bilateral claudication, left leg greater than right Plan: We discussed the natural history of peripheral vascular disease. As he stands currently, he has a very low risk for amputation which is his biggest concern. He does have significant claudication, however to alleviate his symptoms, he would require a below knee popliteal artery bypass graft. I told him that in general this operation is reserved for rest pain or ulceration. He does not have either. He has failed a trial of cilostazol, however he has not tried Trental. I will give him a prescription for that today. He'll come back to see me in 6 months to see how he is doing.     Jorge Ny, M.D. [facility] [place] Office: [phone] Pager:  [phone]

## 2012-09-24 ENCOUNTER — Encounter: Payer: Self-pay | Admitting: Cardiovascular Disease

## 2012-11-07 ENCOUNTER — Ambulatory Visit (HOSPITAL_COMMUNITY)
Admission: RE | Admit: 2012-11-07 | Discharge: 2012-11-07 | Disposition: A | Discharge status: Home / Self Care | Payer: Medicare Other | Source: Ambulatory Visit | Attending: Urology | Admitting: Urology

## 2012-11-07 ENCOUNTER — Encounter (HOSPITAL_COMMUNITY): Payer: Self-pay

## 2012-11-07 ENCOUNTER — Other Ambulatory Visit: Payer: Self-pay | Admitting: Urology

## 2012-11-07 ENCOUNTER — Other Ambulatory Visit: Payer: Self-pay | Admitting: Radiology

## 2012-11-07 DIAGNOSIS — K219 Gastro-esophageal reflux disease without esophagitis: Secondary | ICD-10-CM | POA: Insufficient documentation

## 2012-11-07 DIAGNOSIS — R339 Retention of urine, unspecified: Secondary | ICD-10-CM

## 2012-11-07 DIAGNOSIS — Z923 Personal history of irradiation: Secondary | ICD-10-CM | POA: Insufficient documentation

## 2012-11-07 DIAGNOSIS — I739 Peripheral vascular disease, unspecified: Secondary | ICD-10-CM | POA: Insufficient documentation

## 2012-11-07 DIAGNOSIS — Z8546 Personal history of malignant neoplasm of prostate: Secondary | ICD-10-CM | POA: Insufficient documentation

## 2012-11-07 DIAGNOSIS — N35919 Unspecified urethral stricture, male, unspecified site: Secondary | ICD-10-CM

## 2012-11-07 DIAGNOSIS — I10 Essential (primary) hypertension: Secondary | ICD-10-CM | POA: Insufficient documentation

## 2012-11-07 DIAGNOSIS — E7800 Pure hypercholesterolemia, unspecified: Secondary | ICD-10-CM | POA: Insufficient documentation

## 2012-11-07 LAB — CBC WITH DIFFERENTIAL/PLATELET
Basophils Absolute: 0 10*3/uL (ref 0.0–0.1)
Basophils Relative: 0 % (ref 0–1)
Eosinophils Relative: 2 % (ref 0–5)
HCT: 36.7 % — ABNORMAL LOW (ref 39.0–52.0)
Hemoglobin: 12.6 g/dL — ABNORMAL LOW (ref 13.0–17.0)
MCH: 30.9 pg (ref 26.0–34.0)
MCHC: 34.3 g/dL (ref 30.0–36.0)
MCV: 90 fL (ref 78.0–100.0)
Monocytes Absolute: 0.9 10*3/uL (ref 0.1–1.0)
Monocytes Relative: 9 % (ref 3–12)
RDW: 12.9 % (ref 11.5–15.5)

## 2012-11-07 MED ORDER — FENTANYL CITRATE 0.05 MG/ML IJ SOLN
INTRAMUSCULAR | Status: AC | PRN
  Administered 2012-11-07: 100 ug via INTRAVENOUS
  Administered 2012-11-07 (×2): 50 ug via INTRAVENOUS

## 2012-11-07 MED ORDER — SODIUM CHLORIDE 0.9 % IV SOLN: INTRAVENOUS | Status: DC

## 2012-11-07 MED ORDER — CIPROFLOXACIN IN D5W 400 MG/200ML IV SOLN
400.0000 mg | Freq: Once | INTRAVENOUS | Status: AC
  Administered 2012-11-07: 400 mg via INTRAVENOUS
  Filled 2012-11-07 (×2): qty 200

## 2012-11-07 MED ORDER — MIDAZOLAM HCL 2 MG/2ML IJ SOLN
INTRAMUSCULAR | Status: AC | PRN
  Administered 2012-11-07: 0.5 mg via INTRAVENOUS
  Administered 2012-11-07: 1 mg via INTRAVENOUS
  Administered 2012-11-07: 0.5 mg via INTRAVENOUS

## 2012-11-07 MED ORDER — FENTANYL CITRATE 0.05 MG/ML IJ SOLN
INTRAMUSCULAR | Status: AC
  Filled 2012-11-07: qty 6

## 2012-11-07 MED ORDER — CEFAZOLIN SODIUM-DEXTROSE 2-3 GM-% IV SOLR
2.0000 g | Freq: Once | INTRAVENOUS | Status: DC
  Filled 2012-11-07: qty 50

## 2012-11-07 MED ORDER — MIDAZOLAM HCL 2 MG/2ML IJ SOLN
INTRAMUSCULAR | Status: AC
  Filled 2012-11-07: qty 6

## 2012-11-07 MED ORDER — SODIUM CHLORIDE 0.9 % IV SOLN
INTRAVENOUS | Status: DC
  Administered 2012-11-07: 15:00:00 via INTRAVENOUS

## 2012-11-07 NOTE — H&P (Signed)
  HPI: Ethan Harrison is an 79 y.o. male with chronic urethral stricture who usually self catheterizes, but they are not able to get past the stricture at this point and he is in acute urinary retention with significant discomfort. Dr. Margarita Grizzle contacted Dr. Grace Isaac and requested a supra-pubic catheter be placed. PMHx and meds reviewed.  Past Medical History:  Past Medical History  Diagnosis Date  . Hypertension   . GERD (gastroesophageal reflux disease)   . Hypercholesteremia   . Colon polyps   . Peripheral vascular disease   . Prostate cancer     Past Surgical History:  Past Surgical History  Procedure Laterality Date  . Colon surgery  2000  . Cholecystectomy      Bladder     Family History:  Family History  Problem Relation Age of Onset  . Deep vein thrombosis Father     Amputation-Left leg  . Hypertension Father   . Hyperlipidemia Father   . Heart disease Father     Heart Disease before age 65    Social History:  reports that he has quit smoking. He has never used smokeless tobacco. He reports that  drinks alcohol. He reports that he does not use illicit drugs.  Allergies: No Known Allergies  Medications: Current Outpatient Prescriptions. aspirin EC 81 MG tablet  Take 81 mg by mouth daily.   atorvastatin (LIPITOR) 10 MG tablet  Take 20 mg by mouth daily.   doxazosin (CARDURA) 8 MG tablet  Take 8 mg by mouth daily.   gemfibrozil (LOPID) 600 MG tablet  Take 600 mg by mouth 2 (two) times daily before a meal.   ibuprofen (ADVIL,MOTRIN) 200 MG tablet  Take 200 mg by mouth 2 (two) times daily.   lisinopril (PRINIVIL,ZESTRIL) 5 MG tablet  Take 5 mg by mouth daily.   Multiple Vitamin (MULTIVITAMIN WITH MINERALS) TABS  Take 1 tablet by mouth daily.   Multiple Vitamins-Minerals (ICAPS PO)  Take 2 tablets by mouth daily.   ranitidine (ZANTAC) 150 MG tablet  Take 150 mg by mouth 2 (two) times daily.   trospium (SANCTURA) 20 MG tablet  Take 20 mg by mouth at  bedtime.   cilostazol (PLETAL) 50 MG tablet  Take 1 tablet (50 mg total) by mouth 2 (two) times daily.   docusate sodium (COLACE) 100 MG capsule  Take 200 mg by mouth 2 (two) times daily as needed. For     Please HPI for pertinent positives, otherwise complete 10 system ROS negative.  Physical Exam: BP 109/72  Pulse 100  Temp(Src) 97.6 F (36.4 C) (Oral)  Ht 6' (1.829 m)  Wt 227 lb (102.967 kg)  BMI 30.78 kg/m2  SpO2 95% Body mass index is 30.78 kg/(m^2).   General Appearance:  Alert, cooperative, no distress, appears stated age  Head:  Normocephalic, without obvious abnormality, atraumatic  ENT: Unremarkable  Neck: Supple, symmetrical, trachea midline  Lungs:   Clear to auscultation bilaterally, no w/r/r, respirations unlabored without use of accessory muscles.  Heart:  Regular rate and rhythm, S1, S2 normal, no murmur, rub or gallop.  Abdomen:   Soft, mildly tender suprapubic region  Neurologic: Normal affect, no gross deficits.     Assessment/Plan Urinary retention from urethral stricture. Plan for CT guided suprapubic catheter placement. Discussed procedure, risks, complications, use of sedation Labs pending Consent signed in chart  Brayton El PA-C 11/07/2012, 3:15 PM

## 2012-11-07 NOTE — Procedures (Signed)
Technically successful [place] and CT guided drainage catheter placement into the urinary bladder.  No immediate post procedural complications.

## 2012-11-07 NOTE — Progress Notes (Signed)
Patient, spouse and daughter verbalized understanding of discharge instructions. Patient and family demonstrated flushing the supra pubic catheter. Patient and family was taught how and when to flush the catheter. Patient and family are sent home with 10 cc Normal Saline flushes. Patient informed flushing twice a day and calling Dr. Margarita Grizzle in the am for follow-up appointment. Patient is stable at discharge. Patient and family verbalized no further questions or concerns.

## 2012-11-16 ENCOUNTER — Telehealth: Payer: Self-pay | Admitting: *Deleted

## 2012-11-16 ENCOUNTER — Other Ambulatory Visit: Payer: Self-pay | Admitting: Urology

## 2012-11-16 ENCOUNTER — Encounter (HOSPITAL_COMMUNITY): Payer: Self-pay | Admitting: Pharmacy Technician

## 2012-11-16 DIAGNOSIS — C61 Malignant neoplasm of prostate: Secondary | ICD-10-CM

## 2012-11-16 DIAGNOSIS — N35919 Unspecified urethral stricture, male, unspecified site: Secondary | ICD-10-CM

## 2012-11-16 NOTE — Telephone Encounter (Signed)
Ethan Harrison may stop his Pletal on Sunday ( 11-18-12) and Monday (11-19-12) in order to have his procedure on 11-20-12. This was OK'd by Dr. Imogene Burn in Dr. Estanislado Spire absence. He will resume his Pletal according to radiology's guidelines for catheter change.

## 2012-11-18 ENCOUNTER — Other Ambulatory Visit: Payer: Self-pay | Admitting: Radiology

## 2012-11-20 ENCOUNTER — Encounter (HOSPITAL_COMMUNITY): Payer: Self-pay

## 2012-11-20 ENCOUNTER — Ambulatory Visit (HOSPITAL_COMMUNITY)
Admission: RE | Admit: 2012-11-20 | Discharge: 2012-11-20 | Disposition: A | Discharge status: Home / Self Care | Payer: Medicare Other | Source: Ambulatory Visit | Attending: Urology | Admitting: Urology

## 2012-11-20 DIAGNOSIS — I739 Peripheral vascular disease, unspecified: Secondary | ICD-10-CM | POA: Insufficient documentation

## 2012-11-20 DIAGNOSIS — Z7982 Long term (current) use of aspirin: Secondary | ICD-10-CM | POA: Insufficient documentation

## 2012-11-20 DIAGNOSIS — K219 Gastro-esophageal reflux disease without esophagitis: Secondary | ICD-10-CM | POA: Insufficient documentation

## 2012-11-20 DIAGNOSIS — Z79899 Other long term (current) drug therapy: Secondary | ICD-10-CM | POA: Insufficient documentation

## 2012-11-20 DIAGNOSIS — R339 Retention of urine, unspecified: Secondary | ICD-10-CM | POA: Insufficient documentation

## 2012-11-20 DIAGNOSIS — Z87891 Personal history of nicotine dependence: Secondary | ICD-10-CM | POA: Insufficient documentation

## 2012-11-20 DIAGNOSIS — C61 Malignant neoplasm of prostate: Secondary | ICD-10-CM

## 2012-11-20 DIAGNOSIS — E7800 Pure hypercholesterolemia, unspecified: Secondary | ICD-10-CM | POA: Insufficient documentation

## 2012-11-20 DIAGNOSIS — I10 Essential (primary) hypertension: Secondary | ICD-10-CM | POA: Insufficient documentation

## 2012-11-20 DIAGNOSIS — Z8546 Personal history of malignant neoplasm of prostate: Secondary | ICD-10-CM | POA: Insufficient documentation

## 2012-11-20 DIAGNOSIS — N35919 Unspecified urethral stricture, male, unspecified site: Secondary | ICD-10-CM | POA: Insufficient documentation

## 2012-11-20 LAB — APTT: aPTT: 34 seconds (ref 24–37)

## 2012-11-20 LAB — BASIC METABOLIC PANEL
Calcium: 10.1 mg/dL (ref 8.4–10.5)
Creatinine, Ser: 0.9 mg/dL (ref 0.50–1.35)
GFR calc Af Amer: >90 mL/min (ref >90)
GFR calc non Af Amer: 80 mL/min — ABNORMAL LOW (ref >90)
Sodium: 139 mEq/L (ref 135–145)

## 2012-11-20 LAB — CBC
Platelets: 276 10*3/uL (ref 150–400)
RBC: 3.45 MIL/uL — ABNORMAL LOW (ref 4.22–5.81)
RDW: 12.9 % (ref 11.5–15.5)
WBC: 8.8 10*3/uL (ref 4.0–10.5)

## 2012-11-20 LAB — PROTIME-INR: Prothrombin Time: 13.6 seconds (ref 11.6–15.2)

## 2012-11-20 MED ORDER — MIDAZOLAM HCL 2 MG/2ML IJ SOLN
INTRAMUSCULAR | Status: AC
  Filled 2012-11-20: qty 4

## 2012-11-20 MED ORDER — FENTANYL CITRATE 0.05 MG/ML IJ SOLN
INTRAMUSCULAR | Status: AC
  Filled 2012-11-20: qty 4

## 2012-11-20 MED ORDER — LIDOCAINE HCL 1 % IJ SOLN
INTRAMUSCULAR | Status: AC
  Filled 2012-11-20: qty 20

## 2012-11-20 MED ORDER — FENTANYL CITRATE 0.05 MG/ML IJ SOLN
INTRAMUSCULAR | Status: AC | PRN
  Administered 2012-11-20 (×2): 100 ug via INTRAVENOUS

## 2012-11-20 MED ORDER — CIPROFLOXACIN IN D5W 400 MG/200ML IV SOLN
400.0000 mg | INTRAVENOUS | Status: AC
  Administered 2012-11-20: 400 mg via INTRAVENOUS
  Filled 2012-11-20: qty 200

## 2012-11-20 MED ORDER — IOHEXOL 300 MG/ML  SOLN
50.0000 mL | Freq: Once | INTRAMUSCULAR | Status: AC | PRN
  Administered 2012-11-20: 1 mL via INTRAVENOUS

## 2012-11-20 MED ORDER — MIDAZOLAM HCL 2 MG/2ML IJ SOLN
INTRAMUSCULAR | Status: AC | PRN
  Administered 2012-11-20 (×2): 2 mg via INTRAVENOUS

## 2012-11-20 MED ORDER — SODIUM CHLORIDE 0.9 % IV SOLN
INTRAVENOUS | Status: DC
  Administered 2012-11-20: 13:00:00 via INTRAVENOUS

## 2012-11-20 NOTE — H&P (Signed)
HPI: Ethan Harrison is an 79 y.o. male with chronic urethral stricture who usually self catheterizes, but they are not able to get past the stricture at this point and he is in acute urinary retention with significant discomfort. Dr. Margarita Grizzle contacted Dr. Grace Isaac and requested a supra-pubic catheter be placed. Now s/p 12Fr suprapubic catheter placed 6/18 Request for exchange and upsize. PMHx and meds reviewed. He has held his Trental x 2 days.  Past Medical History:  Past Medical History  Diagnosis Date  . Hypertension   . GERD (gastroesophageal reflux disease)   . Hypercholesteremia   . Colon polyps   . Peripheral vascular disease   . Prostate cancer     Past Surgical History:  Past Surgical History  Procedure Laterality Date  . Colon surgery  2000  . Cholecystectomy      Bladder     Family History:  Family History  Problem Relation Age of Onset  . Deep vein thrombosis Father     Amputation-Left leg  . Hypertension Father   . Hyperlipidemia Father   . Heart disease Father     Heart Disease before age 85    Social History:  reports that he has quit smoking. He has never used smokeless tobacco. He reports that  drinks alcohol. He reports that he does not use illicit drugs.  Allergies: No Known Allergies  Medications: Current Outpatient Prescriptions. aspirin EC 81 MG tablet  Take 81 mg by mouth daily.   atorvastatin (LIPITOR) 10 MG tablet  Take 20 mg by mouth daily.   doxazosin (CARDURA) 8 MG tablet  Take 8 mg by mouth daily.   gemfibrozil (LOPID) 600 MG tablet  Take 600 mg by mouth 2 (two) times daily before a meal.   ibuprofen (ADVIL,MOTRIN) 200 MG tablet  Take 200 mg by mouth 2 (two) times daily.   lisinopril (PRINIVIL,ZESTRIL) 5 MG tablet  Take 5 mg by mouth daily.   Multiple Vitamin (MULTIVITAMIN WITH MINERALS) TABS  Take 1 tablet by mouth daily.   Multiple Vitamins-Minerals (ICAPS PO)  Take 2 tablets by mouth daily.   ranitidine (ZANTAC) 150 MG  tablet  Take 150 mg by mouth 2 (two) times daily.   trospium (SANCTURA) 20 MG tablet  Take 20 mg by mouth at bedtime.   cilostazol (PLETAL) 50 MG tablet  Take 1 tablet (50 mg total) by mouth 2 (two) times daily.   docusate sodium (COLACE) 100 MG capsule  Take 200 mg by mouth 2 (two) times daily as needed. For     Please HPI for pertinent positives, otherwise complete 10 system ROS negative.  Physical Exam: Temp: 98.3, HR:102, RR: 20, BP: 137/60   General Appearance:  Alert, cooperative, no distress, appears stated age  Head:  Normocephalic, without obvious abnormality, atraumatic  ENT: Unremarkable  Neck: Supple, symmetrical, trachea midline  Lungs:   Clear to auscultation bilaterally, no w/r/r, respirations unlabored without use of accessory muscles.  Heart:  Regular rate and rhythm, S1, S2 normal, no murmur, rub or gallop.  Abdomen:   Soft, suprapubic catheter intact, site clean, NT  Neurologic: Normal affect, no gross deficits.   Results for orders placed during the hospital encounter of 11/20/12  APTT      Result Value Range   aPTT 34  24 - 37 seconds  BASIC METABOLIC PANEL      Result Value Range   Sodium 139  135 - 145 mEq/L   Potassium 4.6  3.5 - 5.1 mEq/L  Chloride 104  96 - 112 mEq/L   CO2 23  19 - 32 mEq/L   Glucose, Bld 119 (*) 70 - 99 mg/dL   BUN 24 (*) 6 - 23 mg/dL   Creatinine, Ser 1.61  0.50 - 1.35 mg/dL   Calcium 09.6  8.4 - 04.5 mg/dL   GFR calc non Af Amer 80 (*) >90 mL/min   GFR calc Af Amer >90  >90 mL/min  CBC      Result Value Range   WBC 8.8  4.0 - 10.5 K/uL   RBC 3.45 (*) 4.22 - 5.81 MIL/uL   Hemoglobin 10.9 (*) 13.0 - 17.0 g/dL   HCT 40.9 (*) 81.1 - 91.4 %   MCV 90.7  78.0 - 100.0 fL   MCH 31.6  26.0 - 34.0 pg   MCHC 34.8  30.0 - 36.0 g/dL   RDW 78.2  95.6 - 21.3 %   Platelets 276  150 - 400 K/uL  PROTIME-INR      Result Value Range   Prothrombin Time 13.6  11.6 - 15.2 seconds   INR 1.06  0.00 - 1.49     Assessment/Plan Urinary  retention from urethral stricture, s/p SP tube placement 6/18 Plan for exchange and upsize today. Discussed procedure, risks, complications, use of sedation Labs pending Consent signed in chart  Brayton El PA-C 11/20/2012, 2:20 PM

## 2012-11-20 NOTE — Procedures (Signed)
Suprapubic cath exchange 16 Fr No comp

## 2012-12-10 ENCOUNTER — Other Ambulatory Visit: Payer: Self-pay | Admitting: Surgery

## 2012-12-26 ENCOUNTER — Other Ambulatory Visit: Payer: Self-pay

## 2013-01-17 ENCOUNTER — Encounter: Payer: Self-pay | Admitting: *Deleted

## 2013-01-19 ENCOUNTER — Other Ambulatory Visit: Payer: Self-pay | Admitting: Surgery

## 2013-01-24 ENCOUNTER — Ambulatory Visit (INDEPENDENT_AMBULATORY_CARE_PROVIDER_SITE_OTHER): Payer: Medicare Other | Admitting: Cardiovascular Disease

## 2013-01-24 ENCOUNTER — Encounter: Payer: Self-pay | Admitting: Cardiovascular Disease

## 2013-01-24 VITALS — BP 138/78 | HR 97 | Ht 72.0 in | Wt 222.0 lb

## 2013-01-24 DIAGNOSIS — I70219 Atherosclerosis of native arteries of extremities with intermittent claudication, unspecified extremity: Secondary | ICD-10-CM

## 2013-01-24 DIAGNOSIS — E785 Hyperlipidemia, unspecified: Secondary | ICD-10-CM | POA: Insufficient documentation

## 2013-01-24 DIAGNOSIS — I10 Essential (primary) hypertension: Secondary | ICD-10-CM

## 2013-01-24 NOTE — Assessment & Plan Note (Signed)
I performed lower extremity angiography on Ethan Harrison 06/26/12 revealing subtotally occluded SFAs bilaterally which were highly calcified with three-vessel runoff on the right and 2 on the left. I cannot dictate these were percutaneously addressable. I referred him to Dr. Durene Cal who also felt that he was not a good candidate for him below the knee bypass grafting and suggested medical therapy with Pletal and/or Trental which were minimally effective. The patient continues to have lifestyle limiting claudication.

## 2013-01-24 NOTE — Progress Notes (Signed)
01/24/2013 Ethan Harrison   [birth year]  [id number]  Primary Physician Ethan Ora, MD Primary Cardiologist: Ethan Gess MD Ethan Harrison   HPI:  The patient is a very pleasant 79 year old, mildly overweight, married Caucasian male, father of 1 living child (2 deceased,) grandfather to 2 grandchildren who I initially saw in the office June 01, 2012, for claudication and second opinion. His wife Ethan Harrison is also a patient of mine whose right common carotid artery I stented via the right brachial approach. His risk factors include remote tobacco abuse, hypertension and hyperlipidemia. His ABIs were in the 0.6 range bilaterally with occluded SFAs. He had a negative Myoview June 08, 2012. I angiogram'd him June 26, 2012, revealing diffusely calcified and occluded SFAs bilaterally with tibial disease as well. I did not think he was percutaneously revascularizable. I did begin him on Pletal which he did not tolerate nor did it provide any symptomatic relief.  He saw Dr. Durene Harrison in the office for evaluation of the possibility for a femoropopliteal bypass grafting however, he thought this was not optimal therapy since he would have to perform below-the-knee grafting which he reserves for critical limb ischemia. The patient continues to have a lifestyle limiting claudication.     Current Outpatient Prescriptions  Medication Sig Dispense Refill  . aspirin EC 81 MG tablet Take 81 mg by mouth daily.       Marland Kitchen atorvastatin (LIPITOR) 10 MG tablet Take 20 mg by mouth every morning.       . docusate sodium (COLACE) 100 MG capsule Take 200 mg by mouth at bedtime as needed for constipation. For constipation      . doxazosin (CARDURA) 8 MG tablet Take 8 mg by mouth every morning.       Marland Kitchen gemfibrozil (LOPID) 600 MG tablet Take 600 mg by mouth 2 (two) times daily before a meal.       . lisinopril (PRINIVIL,ZESTRIL) 5 MG tablet Take 5 mg by mouth every morning.       . Multiple Vitamin  (MULTIVITAMIN WITH MINERALS) TABS Take 1 tablet by mouth daily.      . Multiple Vitamins-Minerals (ICAPS PO) Take 2 tablets by mouth daily.      . ranitidine (ZANTAC) 150 MG tablet Take 150 mg by mouth 2 (two) times daily as needed for heartburn.       . trospium (SANCTURA) 20 MG tablet Take 20 mg by mouth at bedtime.      . pentoxifylline (TRENTAL) 400 MG CR tablet TAKE ONE TABLET BY MOUTH TWICE DAILY   60 tablet  6   No current facility-administered medications for this visit.    Allergies  Allergen Reactions  . Pletal [Cilostazol]     intolerant    History   Social History  . Marital Status: Married    Spouse Name: N/A    Number of Children: N/A  . Years of Education: N/A   Occupational History  . Not on file.   Social History Main Topics  . Smoking status: Former Games developer  . Smokeless tobacco: Never Used  . Alcohol Use: Yes     Comment: occ  . Drug Use: No  . Sexual Activity: Not on file   Other Topics Concern  . Not on file   Social History Narrative  . No narrative on file     Review of Systems: General: negative for chills, fever, night sweats or weight changes.  Cardiovascular: negative for chest pain, dyspnea on exertion,  edema, orthopnea, palpitations, paroxysmal nocturnal dyspnea or shortness of breath Dermatological: negative for rash Respiratory: negative for cough or wheezing Urologic: negative for hematuria Abdominal: negative for nausea, vomiting, diarrhea, bright red blood per rectum, melena, or hematemesis Neurologic: negative for visual changes, syncope, or dizziness All other systems reviewed and are otherwise negative except as noted above.    Blood pressure 138/78, pulse 97, height 6' (1.829 m), weight 222 lb (100.699 kg).  General appearance: alert and no distress Neck: no adenopathy, no carotid bruit, no JVD, supple, symmetrical, trachea midline and thyroid not enlarged, symmetric, no tenderness/mass/nodules Lungs: clear to auscultation  bilaterally Heart: regular rate and rhythm, S1, S2 normal, no murmur, click, rub or gallop Extremities: extremities normal, atraumatic, no cyanosis or edema  EKG sinus rhythm at 98 with septal Q waves  ASSESSMENT AND PLAN:   Atherosclerosis of native arteries of the extremities with intermittent claudication I performed lower extremity angiography on Ethan Harrison 06/26/12 revealing subtotally occluded SFAs bilaterally which were highly calcified with three-vessel runoff on the right and 2 on the left. I cannot dictate these were percutaneously addressable. I referred him to Dr. Durene Harrison who also felt that he was not a good candidate for him below the knee bypass grafting and suggested medical therapy with Pletal and/or Trental which were minimally effective. The patient continues to have lifestyle limiting claudication.  Essential hypertension Well-controlled on current medications  Hyperlipidemia On statin therapy followed by his PCP      Ethan Gess MD [facility] 01/24/2013 12:20 PM

## 2013-01-24 NOTE — Assessment & Plan Note (Signed)
On statin therapy followed by his PCP 

## 2013-01-24 NOTE — Assessment & Plan Note (Signed)
Well-controlled on current medications 

## 2013-01-24 NOTE — Patient Instructions (Addendum)
Your physician wants you to follow-up in: 1 year with Dr Berry. You will receive a reminder letter in the mail two months in advance. If you don't receive a letter, please call our office to schedule the follow-up appointment.  

## 2013-02-08 ENCOUNTER — Encounter: Payer: Self-pay | Admitting: Surgery

## 2013-02-11 ENCOUNTER — Ambulatory Visit (INDEPENDENT_AMBULATORY_CARE_PROVIDER_SITE_OTHER): Payer: Medicare Other | Admitting: Surgery

## 2013-02-11 ENCOUNTER — Encounter: Payer: Self-pay | Admitting: Surgery

## 2013-02-11 VITALS — BP 103/59 | HR 102 | Ht 72.0 in | Wt 226.4 lb

## 2013-02-11 DIAGNOSIS — I70219 Atherosclerosis of native arteries of extremities with intermittent claudication, unspecified extremity: Secondary | ICD-10-CM

## 2013-02-11 NOTE — Progress Notes (Signed)
[facility] [place]   Patient name: Ethan Harrison MRN: [id number] DOB: [birth year] Sex: male     Chief Complaint  Patient presents with  . Re-evaluation    6 month f/u - pt states that sx are still the same    HISTORY OF PRESENT ILLNESS: The patient is back today for followup. He suffers from bilateral claudication. He has undergone angiography by Dr. Allyson Sabal. He has bilateral superficial femoral and popliteal stenosis/occlusion with reconstitution of the below-knee popliteal artery. He developed symptoms approximately 100 yards. I sought 6 months ago and our conclusion was that we would treat him medically as he was able to tolerate his symptoms. He did not tolerate cilostazol, therefore we started Trental. He notes no significant improvement. He states that his symptoms have changed.  Past Medical History  Diagnosis Date  . Hypertension   . GERD (gastroesophageal reflux disease)   . Hypercholesteremia   . Colon polyps   . Peripheral vascular disease jan 2014    claudication  . Prostate cancer     Past Surgical History  Procedure Laterality Date  . Colon surgery  2000  . Cholecystectomy      Bladder   . Lower  peripheral angiogram  Jun 26 2012    revealing diffusely calcifed and occluded SFAs bilaterally with tibal disease -not percutaneously revvascularizable  . Lower arterial doppler  Jan 2014    ABIs were 0.6 range bilaterally with occluded SFAs  . Nm myocar perf wall motion  Jan 17,2014    negative    History   Social History  . Marital Status: Married    Spouse Name: N/A    Number of Children: N/A  . Years of Education: N/A   Occupational History  . Not on file.   Social History Main Topics  . Smoking status: Former Games developer  . Smokeless tobacco: Never Used  . Alcohol Use: Yes     Comment: occ  . Drug Use: No  . Sexual Activity: Not on file   Other Topics Concern  . Not on file   Social History Narrative  . No narrative on file     Family History  Problem Relation Age of Onset  . Deep vein thrombosis Father     Amputation-Left leg  . Hypertension Father   . Hyperlipidemia Father   . Heart disease Father     Heart Disease before age 39    Allergies as of 02/11/2013 - Review Complete 02/11/2013  Allergen Reaction Noted  . Pletal [cilostazol]  01/17/2013    Current Outpatient Prescriptions on File Prior to Visit  Medication Sig Dispense Refill  . aspirin EC 81 MG tablet Take 81 mg by mouth daily.       Marland Kitchen atorvastatin (LIPITOR) 10 MG tablet Take 20 mg by mouth every morning.       . docusate sodium (COLACE) 100 MG capsule Take 200 mg by mouth at bedtime as needed for constipation. For constipation      . doxazosin (CARDURA) 8 MG tablet Take 8 mg by mouth every morning.       Marland Kitchen gemfibrozil (LOPID) 600 MG tablet Take 600 mg by mouth 2 (two) times daily before a meal.       . lisinopril (PRINIVIL,ZESTRIL) 5 MG tablet Take 5 mg by mouth every morning.       . Multiple Vitamin (MULTIVITAMIN WITH MINERALS) TABS Take 1 tablet by mouth daily.      . Multiple Vitamins-Minerals (  ICAPS PO) Take 2 tablets by mouth daily.      . pentoxifylline (TRENTAL) 400 MG CR tablet TAKE ONE TABLET BY MOUTH TWICE DAILY   60 tablet  6  . ranitidine (ZANTAC) 150 MG tablet Take 150 mg by mouth 2 (two) times daily as needed for heartburn.       . trospium (SANCTURA) 20 MG tablet Take 20 mg by mouth at bedtime.       No current facility-administered medications on file prior to visit.     REVIEW OF SYSTEMS: No change from prior visit  PHYSICAL EXAMINATION:   Vital signs are BP 103/59  Pulse 102  Ht 6' (1.829 m)  Wt 226 lb 6.4 oz (102.694 kg)  BMI 30.7 kg/m2  SpO2 97% General: The patient appears their stated age. HEENT:  No gross abnormalities Pulmonary:  Non labored breathing Musculoskeletal: There are no major deformities. Neurologic: No focal weakness or paresthesias are detected, Skin: There are no ulcer or rashes  noted. Psychiatric: The patient has normal affect. Cardiovascular: There is a regular rate and rhythm without significant murmur appreciated. Pedal pulses are not palpable. No carotid bruits.   Diagnostic Studies None  Assessment: Peripheral vascular  disease Plan: The patient's symptoms have been stable over the course of the past 6 months. These are somewhat lifestyle limiting for him, however he has adapted well. He is not interested in surgical revascularization at this time. I discussed monitoring his feet for nonhealing wounds. If his symptoms get worse, he will contact me, otherwise I would not schedule him for followup, as he is getting his followup at [facility]  V. Charlena Cross, M.D. [facility] [place] Office: [phone] Pager:  [phone]

## 2013-03-28 ENCOUNTER — Other Ambulatory Visit: Payer: Self-pay

## 2013-07-14 ENCOUNTER — Other Ambulatory Visit: Payer: Self-pay | Admitting: Cardiovascular Disease

## 2013-11-26 ENCOUNTER — Telehealth: Payer: Self-pay | Admitting: Cardiovascular Disease

## 2013-11-26 NOTE — Telephone Encounter (Signed)
Per Judie Bonus Ecker(daughther) her father passed away on Nov 28, 2013.Ethan Harrison

## 2013-12-21 DEATH — deceased

## 2014-01-24 ENCOUNTER — Ambulatory Visit: Payer: Medicare Other | Admitting: Cardiovascular Disease
